# Patient Record
Sex: Female | Born: 1935 | Race: White | Hispanic: No | Marital: Married | Smoking: Former smoker
Health system: Southern US, Community
[De-identification: ages and names within clinical notes are randomized; demographics above are authoritative.]

## PROBLEM LIST (undated history)

## (undated) DIAGNOSIS — Z923 Personal history of irradiation: Secondary | ICD-10-CM

## (undated) DIAGNOSIS — M810 Age-related osteoporosis without current pathological fracture: Secondary | ICD-10-CM

## (undated) DIAGNOSIS — T7840XA Allergy, unspecified, initial encounter: Secondary | ICD-10-CM

## (undated) DIAGNOSIS — C50912 Malignant neoplasm of unspecified site of left female breast: Secondary | ICD-10-CM

## (undated) DIAGNOSIS — I341 Nonrheumatic mitral (valve) prolapse: Secondary | ICD-10-CM

## (undated) HISTORY — DX: Personal history of irradiation: Z92.3

## (undated) HISTORY — DX: Nonrheumatic mitral (valve) prolapse: I34.1

## (undated) HISTORY — DX: Allergy, unspecified, initial encounter: T78.40XA

## (undated) HISTORY — PX: TONSILLECTOMY: SUR1361

## (undated) HISTORY — DX: Age-related osteoporosis without current pathological fracture: M81.0

---

## 1997-10-26 ENCOUNTER — Ambulatory Visit (HOSPITAL_COMMUNITY): Admission: RE | Admit: 1997-10-26 | Discharge: 1997-10-26 | Payer: Self-pay | Admitting: Obstetrics and Gynecology

## 1997-11-29 ENCOUNTER — Other Ambulatory Visit: Admission: RE | Admit: 1997-11-29 | Discharge: 1997-11-29 | Payer: Self-pay | Admitting: Obstetrics and Gynecology

## 1998-12-30 ENCOUNTER — Ambulatory Visit (HOSPITAL_COMMUNITY): Admission: RE | Admit: 1998-12-30 | Discharge: 1998-12-30 | Payer: Self-pay | Admitting: Obstetrics and Gynecology

## 1998-12-30 ENCOUNTER — Encounter: Payer: Self-pay | Admitting: Obstetrics and Gynecology

## 1999-03-11 ENCOUNTER — Other Ambulatory Visit: Admission: RE | Admit: 1999-03-11 | Discharge: 1999-03-11 | Payer: Self-pay | Admitting: Obstetrics and Gynecology

## 2000-08-05 ENCOUNTER — Encounter: Payer: Self-pay | Admitting: Obstetrics and Gynecology

## 2000-08-05 ENCOUNTER — Ambulatory Visit (HOSPITAL_COMMUNITY): Admission: RE | Admit: 2000-08-05 | Discharge: 2000-08-05 | Payer: Self-pay | Admitting: Obstetrics and Gynecology

## 2000-08-19 ENCOUNTER — Other Ambulatory Visit: Admission: RE | Admit: 2000-08-19 | Discharge: 2000-08-19 | Payer: Self-pay | Admitting: Obstetrics and Gynecology

## 2001-08-22 ENCOUNTER — Other Ambulatory Visit: Admission: RE | Admit: 2001-08-22 | Discharge: 2001-08-22 | Payer: Self-pay | Admitting: Obstetrics and Gynecology

## 2001-10-06 ENCOUNTER — Encounter: Admission: RE | Admit: 2001-10-06 | Discharge: 2001-10-06 | Payer: Self-pay | Admitting: Obstetrics and Gynecology

## 2001-10-06 ENCOUNTER — Encounter: Payer: Self-pay | Admitting: Obstetrics and Gynecology

## 2001-12-27 ENCOUNTER — Encounter: Payer: Self-pay | Admitting: Obstetrics and Gynecology

## 2001-12-27 ENCOUNTER — Ambulatory Visit (HOSPITAL_COMMUNITY): Admission: RE | Admit: 2001-12-27 | Discharge: 2001-12-27 | Payer: Self-pay | Admitting: Obstetrics and Gynecology

## 2004-09-01 ENCOUNTER — Other Ambulatory Visit: Admission: RE | Admit: 2004-09-01 | Discharge: 2004-09-01 | Payer: Self-pay | Admitting: Obstetrics and Gynecology

## 2005-09-11 ENCOUNTER — Ambulatory Visit (HOSPITAL_COMMUNITY): Admission: RE | Admit: 2005-09-11 | Discharge: 2005-09-11 | Payer: Self-pay | Admitting: Obstetrics and Gynecology

## 2005-10-01 ENCOUNTER — Other Ambulatory Visit: Admission: RE | Admit: 2005-10-01 | Discharge: 2005-10-01 | Payer: Self-pay | Admitting: Obstetrics and Gynecology

## 2006-09-24 ENCOUNTER — Ambulatory Visit (HOSPITAL_COMMUNITY): Admission: RE | Admit: 2006-09-24 | Discharge: 2006-09-24 | Payer: Self-pay | Admitting: Obstetrics and Gynecology

## 2006-10-14 ENCOUNTER — Other Ambulatory Visit: Admission: RE | Admit: 2006-10-14 | Discharge: 2006-10-14 | Payer: Self-pay | Admitting: Obstetrics and Gynecology

## 2007-12-06 ENCOUNTER — Ambulatory Visit (HOSPITAL_COMMUNITY): Admission: RE | Admit: 2007-12-06 | Discharge: 2007-12-06 | Payer: Self-pay | Admitting: Cardiology

## 2008-12-24 ENCOUNTER — Ambulatory Visit (HOSPITAL_COMMUNITY): Admission: RE | Admit: 2008-12-24 | Discharge: 2008-12-24 | Payer: Self-pay | Admitting: Family Medicine

## 2009-12-25 ENCOUNTER — Ambulatory Visit (HOSPITAL_COMMUNITY): Admission: RE | Admit: 2009-12-25 | Discharge: 2009-12-25 | Payer: Self-pay | Admitting: Family Medicine

## 2010-02-12 ENCOUNTER — Other Ambulatory Visit: Admission: RE | Admit: 2010-02-12 | Discharge: 2010-02-12 | Payer: Self-pay | Admitting: Obstetrics and Gynecology

## 2010-05-04 ENCOUNTER — Encounter: Payer: Self-pay | Admitting: Obstetrics and Gynecology

## 2011-03-02 ENCOUNTER — Other Ambulatory Visit (HOSPITAL_COMMUNITY): Payer: Self-pay | Admitting: Family Medicine

## 2011-03-02 DIAGNOSIS — Z1231 Encounter for screening mammogram for malignant neoplasm of breast: Secondary | ICD-10-CM

## 2011-04-01 ENCOUNTER — Ambulatory Visit (HOSPITAL_COMMUNITY)
Admission: RE | Admit: 2011-04-01 | Discharge: 2011-04-01 | Disposition: A | Discharge status: Home / Self Care | Payer: Medicare Other | Source: Ambulatory Visit | Attending: Family Medicine | Admitting: Family Medicine

## 2011-04-01 DIAGNOSIS — Z1231 Encounter for screening mammogram for malignant neoplasm of breast: Secondary | ICD-10-CM | POA: Insufficient documentation

## 2012-04-28 ENCOUNTER — Other Ambulatory Visit (HOSPITAL_COMMUNITY): Payer: Self-pay | Admitting: Family Medicine

## 2012-04-28 DIAGNOSIS — Z1231 Encounter for screening mammogram for malignant neoplasm of breast: Secondary | ICD-10-CM

## 2012-05-06 ENCOUNTER — Ambulatory Visit (HOSPITAL_COMMUNITY): Payer: Medicare Other

## 2012-05-12 ENCOUNTER — Ambulatory Visit (HOSPITAL_COMMUNITY)
Admission: RE | Admit: 2012-05-12 | Discharge: 2012-05-12 | Disposition: A | Discharge status: Home / Self Care | Payer: Medicare Other | Source: Ambulatory Visit | Attending: Family Medicine | Admitting: Family Medicine

## 2012-05-12 DIAGNOSIS — Z1231 Encounter for screening mammogram for malignant neoplasm of breast: Secondary | ICD-10-CM | POA: Insufficient documentation

## 2013-05-29 ENCOUNTER — Other Ambulatory Visit (HOSPITAL_COMMUNITY): Payer: Self-pay | Admitting: Family Medicine

## 2013-05-29 DIAGNOSIS — Z1231 Encounter for screening mammogram for malignant neoplasm of breast: Secondary | ICD-10-CM

## 2013-06-29 ENCOUNTER — Ambulatory Visit (HOSPITAL_COMMUNITY): Payer: Medicare Other

## 2013-07-04 ENCOUNTER — Ambulatory Visit (HOSPITAL_COMMUNITY)
Admission: RE | Admit: 2013-07-04 | Discharge: 2013-07-04 | Disposition: A | Discharge status: Home / Self Care | Payer: Medicare HMO | Source: Ambulatory Visit | Attending: Family Medicine | Admitting: Family Medicine

## 2013-07-04 DIAGNOSIS — Z1231 Encounter for screening mammogram for malignant neoplasm of breast: Secondary | ICD-10-CM

## 2014-07-02 ENCOUNTER — Other Ambulatory Visit (HOSPITAL_COMMUNITY): Payer: Self-pay | Admitting: Family Medicine

## 2014-07-02 DIAGNOSIS — Z1231 Encounter for screening mammogram for malignant neoplasm of breast: Secondary | ICD-10-CM

## 2014-07-16 ENCOUNTER — Ambulatory Visit (HOSPITAL_COMMUNITY): Payer: Medicare HMO | Attending: Family Medicine

## 2014-07-24 ENCOUNTER — Ambulatory Visit (HOSPITAL_COMMUNITY): Payer: Medicare HMO | Attending: Family Medicine

## 2014-07-25 ENCOUNTER — Ambulatory Visit (HOSPITAL_COMMUNITY)
Admission: RE | Admit: 2014-07-25 | Discharge: 2014-07-25 | Disposition: A | Discharge status: Home / Self Care | Payer: Medicare HMO | Source: Ambulatory Visit | Attending: Family Medicine | Admitting: Family Medicine

## 2014-07-25 DIAGNOSIS — Z1231 Encounter for screening mammogram for malignant neoplasm of breast: Secondary | ICD-10-CM | POA: Diagnosis not present

## 2015-03-12 DIAGNOSIS — Z23 Encounter for immunization: Secondary | ICD-10-CM | POA: Diagnosis not present

## 2015-05-23 DIAGNOSIS — L814 Other melanin hyperpigmentation: Secondary | ICD-10-CM | POA: Diagnosis not present

## 2015-05-23 DIAGNOSIS — L821 Other seborrheic keratosis: Secondary | ICD-10-CM | POA: Diagnosis not present

## 2015-05-23 DIAGNOSIS — L853 Xerosis cutis: Secondary | ICD-10-CM | POA: Diagnosis not present

## 2015-05-23 DIAGNOSIS — D1801 Hemangioma of skin and subcutaneous tissue: Secondary | ICD-10-CM | POA: Diagnosis not present

## 2015-11-21 DIAGNOSIS — M810 Age-related osteoporosis without current pathological fracture: Secondary | ICD-10-CM | POA: Diagnosis not present

## 2015-11-28 DIAGNOSIS — M810 Age-related osteoporosis without current pathological fracture: Secondary | ICD-10-CM | POA: Diagnosis not present

## 2015-11-28 DIAGNOSIS — E559 Vitamin D deficiency, unspecified: Secondary | ICD-10-CM | POA: Diagnosis not present

## 2015-11-28 DIAGNOSIS — R7309 Other abnormal glucose: Secondary | ICD-10-CM | POA: Diagnosis not present

## 2015-11-28 DIAGNOSIS — Z23 Encounter for immunization: Secondary | ICD-10-CM | POA: Diagnosis not present

## 2016-01-13 ENCOUNTER — Other Ambulatory Visit: Payer: Self-pay | Admitting: Family Medicine

## 2016-01-13 DIAGNOSIS — Z1231 Encounter for screening mammogram for malignant neoplasm of breast: Secondary | ICD-10-CM

## 2016-01-13 DIAGNOSIS — R69 Illness, unspecified: Secondary | ICD-10-CM | POA: Diagnosis not present

## 2016-01-23 ENCOUNTER — Ambulatory Visit
Admission: RE | Admit: 2016-01-23 | Discharge: 2016-01-23 | Disposition: A | Discharge status: Home / Self Care | Payer: Medicare HMO | Source: Ambulatory Visit | Attending: Family Medicine | Admitting: Family Medicine

## 2016-01-23 DIAGNOSIS — Z1231 Encounter for screening mammogram for malignant neoplasm of breast: Secondary | ICD-10-CM | POA: Diagnosis not present

## 2016-01-31 DIAGNOSIS — J302 Other seasonal allergic rhinitis: Secondary | ICD-10-CM | POA: Diagnosis not present

## 2016-01-31 DIAGNOSIS — R7303 Prediabetes: Secondary | ICD-10-CM | POA: Diagnosis not present

## 2016-01-31 DIAGNOSIS — E7800 Pure hypercholesterolemia, unspecified: Secondary | ICD-10-CM | POA: Diagnosis not present

## 2016-01-31 DIAGNOSIS — E559 Vitamin D deficiency, unspecified: Secondary | ICD-10-CM | POA: Diagnosis not present

## 2016-01-31 DIAGNOSIS — R7301 Impaired fasting glucose: Secondary | ICD-10-CM | POA: Diagnosis not present

## 2016-01-31 DIAGNOSIS — M810 Age-related osteoporosis without current pathological fracture: Secondary | ICD-10-CM | POA: Diagnosis not present

## 2016-01-31 DIAGNOSIS — Z1389 Encounter for screening for other disorder: Secondary | ICD-10-CM | POA: Diagnosis not present

## 2016-01-31 DIAGNOSIS — Z0000 Encounter for general adult medical examination without abnormal findings: Secondary | ICD-10-CM | POA: Diagnosis not present

## 2016-05-14 DIAGNOSIS — R69 Illness, unspecified: Secondary | ICD-10-CM | POA: Diagnosis not present

## 2016-05-14 DIAGNOSIS — Z0101 Encounter for examination of eyes and vision with abnormal findings: Secondary | ICD-10-CM | POA: Diagnosis not present

## 2016-05-14 DIAGNOSIS — H524 Presbyopia: Secondary | ICD-10-CM | POA: Diagnosis not present

## 2016-05-20 DIAGNOSIS — D2371 Other benign neoplasm of skin of right lower limb, including hip: Secondary | ICD-10-CM | POA: Diagnosis not present

## 2016-05-20 DIAGNOSIS — L814 Other melanin hyperpigmentation: Secondary | ICD-10-CM | POA: Diagnosis not present

## 2016-05-20 DIAGNOSIS — L438 Other lichen planus: Secondary | ICD-10-CM | POA: Diagnosis not present

## 2016-05-20 DIAGNOSIS — L821 Other seborrheic keratosis: Secondary | ICD-10-CM | POA: Diagnosis not present

## 2016-05-20 DIAGNOSIS — L820 Inflamed seborrheic keratosis: Secondary | ICD-10-CM | POA: Diagnosis not present

## 2016-06-04 DIAGNOSIS — H02423 Myogenic ptosis of bilateral eyelids: Secondary | ICD-10-CM | POA: Diagnosis not present

## 2016-06-04 DIAGNOSIS — H53483 Generalized contraction of visual field, bilateral: Secondary | ICD-10-CM | POA: Diagnosis not present

## 2016-06-04 DIAGNOSIS — H02834 Dermatochalasis of left upper eyelid: Secondary | ICD-10-CM | POA: Diagnosis not present

## 2016-06-04 DIAGNOSIS — H02413 Mechanical ptosis of bilateral eyelids: Secondary | ICD-10-CM | POA: Diagnosis not present

## 2016-06-04 DIAGNOSIS — H0279 Other degenerative disorders of eyelid and periocular area: Secondary | ICD-10-CM | POA: Diagnosis not present

## 2016-06-04 DIAGNOSIS — H02831 Dermatochalasis of right upper eyelid: Secondary | ICD-10-CM | POA: Diagnosis not present

## 2016-06-15 DIAGNOSIS — Z0101 Encounter for examination of eyes and vision with abnormal findings: Secondary | ICD-10-CM | POA: Diagnosis not present

## 2016-06-29 DIAGNOSIS — H02413 Mechanical ptosis of bilateral eyelids: Secondary | ICD-10-CM | POA: Diagnosis not present

## 2016-06-29 DIAGNOSIS — H02831 Dermatochalasis of right upper eyelid: Secondary | ICD-10-CM | POA: Diagnosis not present

## 2016-06-29 DIAGNOSIS — H02834 Dermatochalasis of left upper eyelid: Secondary | ICD-10-CM | POA: Diagnosis not present

## 2016-12-01 DIAGNOSIS — J069 Acute upper respiratory infection, unspecified: Secondary | ICD-10-CM | POA: Diagnosis not present

## 2016-12-01 DIAGNOSIS — R05 Cough: Secondary | ICD-10-CM | POA: Diagnosis not present

## 2016-12-15 DIAGNOSIS — R69 Illness, unspecified: Secondary | ICD-10-CM | POA: Diagnosis not present

## 2016-12-28 ENCOUNTER — Other Ambulatory Visit: Payer: Self-pay | Admitting: Family Medicine

## 2016-12-28 DIAGNOSIS — Z1231 Encounter for screening mammogram for malignant neoplasm of breast: Secondary | ICD-10-CM

## 2017-01-11 DIAGNOSIS — R69 Illness, unspecified: Secondary | ICD-10-CM | POA: Diagnosis not present

## 2017-01-27 ENCOUNTER — Ambulatory Visit
Admission: RE | Admit: 2017-01-27 | Discharge: 2017-01-27 | Disposition: A | Discharge status: Home / Self Care | Payer: Medicare HMO | Source: Ambulatory Visit | Attending: Family Medicine | Admitting: Family Medicine

## 2017-01-27 DIAGNOSIS — Z1231 Encounter for screening mammogram for malignant neoplasm of breast: Secondary | ICD-10-CM

## 2017-02-12 DIAGNOSIS — Z0000 Encounter for general adult medical examination without abnormal findings: Secondary | ICD-10-CM | POA: Diagnosis not present

## 2017-02-12 DIAGNOSIS — Z1211 Encounter for screening for malignant neoplasm of colon: Secondary | ICD-10-CM | POA: Diagnosis not present

## 2017-02-12 DIAGNOSIS — Z1389 Encounter for screening for other disorder: Secondary | ICD-10-CM | POA: Diagnosis not present

## 2017-02-12 DIAGNOSIS — J302 Other seasonal allergic rhinitis: Secondary | ICD-10-CM | POA: Diagnosis not present

## 2017-02-12 DIAGNOSIS — E7800 Pure hypercholesterolemia, unspecified: Secondary | ICD-10-CM | POA: Diagnosis not present

## 2017-02-12 DIAGNOSIS — M810 Age-related osteoporosis without current pathological fracture: Secondary | ICD-10-CM | POA: Diagnosis not present

## 2017-02-12 DIAGNOSIS — R7303 Prediabetes: Secondary | ICD-10-CM | POA: Diagnosis not present

## 2017-02-12 DIAGNOSIS — E559 Vitamin D deficiency, unspecified: Secondary | ICD-10-CM | POA: Diagnosis not present

## 2017-05-19 DIAGNOSIS — D126 Benign neoplasm of colon, unspecified: Secondary | ICD-10-CM | POA: Diagnosis not present

## 2017-05-19 DIAGNOSIS — Z86010 Personal history of colonic polyps: Secondary | ICD-10-CM | POA: Diagnosis not present

## 2017-05-25 DIAGNOSIS — D126 Benign neoplasm of colon, unspecified: Secondary | ICD-10-CM | POA: Diagnosis not present

## 2017-05-28 DIAGNOSIS — D2372 Other benign neoplasm of skin of left lower limb, including hip: Secondary | ICD-10-CM | POA: Diagnosis not present

## 2017-05-28 DIAGNOSIS — D1801 Hemangioma of skin and subcutaneous tissue: Secondary | ICD-10-CM | POA: Diagnosis not present

## 2017-05-28 DIAGNOSIS — D224 Melanocytic nevi of scalp and neck: Secondary | ICD-10-CM | POA: Diagnosis not present

## 2017-05-28 DIAGNOSIS — L814 Other melanin hyperpigmentation: Secondary | ICD-10-CM | POA: Diagnosis not present

## 2017-05-28 DIAGNOSIS — L821 Other seborrheic keratosis: Secondary | ICD-10-CM | POA: Diagnosis not present

## 2017-05-28 DIAGNOSIS — L738 Other specified follicular disorders: Secondary | ICD-10-CM | POA: Diagnosis not present

## 2017-06-30 DIAGNOSIS — R69 Illness, unspecified: Secondary | ICD-10-CM | POA: Diagnosis not present

## 2017-07-20 DIAGNOSIS — H43813 Vitreous degeneration, bilateral: Secondary | ICD-10-CM | POA: Diagnosis not present

## 2017-07-20 DIAGNOSIS — H524 Presbyopia: Secondary | ICD-10-CM | POA: Diagnosis not present

## 2017-12-09 DIAGNOSIS — M8588 Other specified disorders of bone density and structure, other site: Secondary | ICD-10-CM | POA: Diagnosis not present

## 2017-12-09 DIAGNOSIS — M810 Age-related osteoporosis without current pathological fracture: Secondary | ICD-10-CM | POA: Diagnosis not present

## 2017-12-14 DIAGNOSIS — J069 Acute upper respiratory infection, unspecified: Secondary | ICD-10-CM | POA: Diagnosis not present

## 2018-01-13 ENCOUNTER — Other Ambulatory Visit: Payer: Self-pay | Admitting: Family Medicine

## 2018-01-13 DIAGNOSIS — Z1231 Encounter for screening mammogram for malignant neoplasm of breast: Secondary | ICD-10-CM

## 2018-01-15 DIAGNOSIS — R69 Illness, unspecified: Secondary | ICD-10-CM | POA: Diagnosis not present

## 2018-02-14 DIAGNOSIS — E7800 Pure hypercholesterolemia, unspecified: Secondary | ICD-10-CM | POA: Diagnosis not present

## 2018-02-14 DIAGNOSIS — R7303 Prediabetes: Secondary | ICD-10-CM | POA: Diagnosis not present

## 2018-02-14 DIAGNOSIS — E559 Vitamin D deficiency, unspecified: Secondary | ICD-10-CM | POA: Diagnosis not present

## 2018-02-15 ENCOUNTER — Ambulatory Visit: Payer: Medicare HMO

## 2018-02-17 DIAGNOSIS — M810 Age-related osteoporosis without current pathological fracture: Secondary | ICD-10-CM | POA: Diagnosis not present

## 2018-02-17 DIAGNOSIS — Z0000 Encounter for general adult medical examination without abnormal findings: Secondary | ICD-10-CM | POA: Diagnosis not present

## 2018-02-17 DIAGNOSIS — R7303 Prediabetes: Secondary | ICD-10-CM | POA: Diagnosis not present

## 2018-02-17 DIAGNOSIS — E7800 Pure hypercholesterolemia, unspecified: Secondary | ICD-10-CM | POA: Diagnosis not present

## 2018-02-17 DIAGNOSIS — Z209 Contact with and (suspected) exposure to unspecified communicable disease: Secondary | ICD-10-CM | POA: Diagnosis not present

## 2018-02-17 DIAGNOSIS — J302 Other seasonal allergic rhinitis: Secondary | ICD-10-CM | POA: Diagnosis not present

## 2018-02-17 DIAGNOSIS — D72819 Decreased white blood cell count, unspecified: Secondary | ICD-10-CM | POA: Diagnosis not present

## 2018-02-17 DIAGNOSIS — E559 Vitamin D deficiency, unspecified: Secondary | ICD-10-CM | POA: Diagnosis not present

## 2018-02-17 DIAGNOSIS — Z1389 Encounter for screening for other disorder: Secondary | ICD-10-CM | POA: Diagnosis not present

## 2018-02-17 DIAGNOSIS — Z1211 Encounter for screening for malignant neoplasm of colon: Secondary | ICD-10-CM | POA: Diagnosis not present

## 2018-03-18 DIAGNOSIS — D72819 Decreased white blood cell count, unspecified: Secondary | ICD-10-CM | POA: Diagnosis not present

## 2018-03-30 ENCOUNTER — Encounter: Payer: Self-pay | Admitting: Internal Medicine

## 2018-03-30 ENCOUNTER — Telehealth: Payer: Self-pay | Admitting: Internal Medicine

## 2018-03-30 NOTE — Telephone Encounter (Signed)
New referral received from Dr. Moreen Fowler for the pt to see a hematologist. Pt has been cld and scheduled to see Dr. Walden Field on 04/21/18 at 950am. PPt aware to arrive 30 minutes early. Letter mailed.

## 2018-03-31 ENCOUNTER — Ambulatory Visit
Admission: RE | Admit: 2018-03-31 | Discharge: 2018-03-31 | Disposition: A | Discharge status: Home / Self Care | Payer: Medicare HMO | Source: Ambulatory Visit | Attending: Family Medicine | Admitting: Family Medicine

## 2018-03-31 DIAGNOSIS — Z1231 Encounter for screening mammogram for malignant neoplasm of breast: Secondary | ICD-10-CM | POA: Diagnosis not present

## 2018-04-21 ENCOUNTER — Inpatient Hospital Stay: Payer: PPO

## 2018-04-21 ENCOUNTER — Inpatient Hospital Stay: Payer: PPO | Attending: Internal Medicine | Admitting: Internal Medicine

## 2018-04-21 ENCOUNTER — Encounter: Payer: Self-pay | Admitting: Internal Medicine

## 2018-04-21 ENCOUNTER — Telehealth: Payer: Self-pay

## 2018-04-21 VITALS — BP 147/78 | HR 73 | Temp 99.0°F | Resp 17 | Ht 63.0 in | Wt 120.8 lb

## 2018-04-21 DIAGNOSIS — Z809 Family history of malignant neoplasm, unspecified: Secondary | ICD-10-CM

## 2018-04-21 DIAGNOSIS — Z87891 Personal history of nicotine dependence: Secondary | ICD-10-CM

## 2018-04-21 DIAGNOSIS — Z800 Family history of malignant neoplasm of digestive organs: Secondary | ICD-10-CM

## 2018-04-21 DIAGNOSIS — M810 Age-related osteoporosis without current pathological fracture: Secondary | ICD-10-CM

## 2018-04-21 DIAGNOSIS — D72818 Other decreased white blood cell count: Secondary | ICD-10-CM | POA: Diagnosis not present

## 2018-04-21 DIAGNOSIS — Z79899 Other long term (current) drug therapy: Secondary | ICD-10-CM

## 2018-04-21 DIAGNOSIS — Z806 Family history of leukemia: Secondary | ICD-10-CM

## 2018-04-21 LAB — COMPREHENSIVE METABOLIC PANEL
ALT: 15 U/L (ref 0–44)
AST: 16 U/L (ref 15–41)
Albumin: 3.6 g/dL (ref 3.5–5.0)
Alkaline Phosphatase: 52 U/L (ref 38–126)
Anion gap: 6 (ref 5–15)
BILIRUBIN TOTAL: 1.1 mg/dL (ref 0.3–1.2)
BUN: 12 mg/dL (ref 8–23)
CHLORIDE: 107 mmol/L (ref 98–111)
CO2: 28 mmol/L (ref 22–32)
Calcium: 9.1 mg/dL (ref 8.9–10.3)
Creatinine, Ser: 0.75 mg/dL (ref 0.44–1.00)
Glucose, Bld: 105 mg/dL — ABNORMAL HIGH (ref 70–99)
Potassium: 4.8 mmol/L (ref 3.5–5.1)
Sodium: 141 mmol/L (ref 135–145)
TOTAL PROTEIN: 6.2 g/dL — AB (ref 6.5–8.1)

## 2018-04-21 LAB — CBC WITH DIFFERENTIAL/PLATELET
Abs Immature Granulocytes: 0 10*3/uL (ref 0.00–0.07)
Basophils Absolute: 0 10*3/uL (ref 0.0–0.1)
Basophils Relative: 1 %
EOS ABS: 0 10*3/uL (ref 0.0–0.5)
Eosinophils Relative: 1 %
HCT: 39.2 % (ref 36.0–46.0)
Hemoglobin: 12.7 g/dL (ref 12.0–15.0)
Immature Granulocytes: 0 %
Lymphocytes Relative: 35 %
Lymphs Abs: 1.3 10*3/uL (ref 0.7–4.0)
MCH: 30.3 pg (ref 26.0–34.0)
MCHC: 32.4 g/dL (ref 30.0–36.0)
MCV: 93.6 fL (ref 80.0–100.0)
MONO ABS: 0.4 10*3/uL (ref 0.1–1.0)
MONOS PCT: 9 %
Neutro Abs: 2.1 10*3/uL (ref 1.7–7.7)
Neutrophils Relative %: 54 %
PLATELETS: 174 10*3/uL (ref 150–400)
RBC: 4.19 MIL/uL (ref 3.87–5.11)
RDW: 12.5 % (ref 11.5–15.5)
WBC: 3.8 10*3/uL — ABNORMAL LOW (ref 4.0–10.5)
nRBC: 0 % (ref 0.0–0.2)

## 2018-04-21 LAB — LACTATE DEHYDROGENASE: LDH: 147 U/L (ref 98–192)

## 2018-04-21 NOTE — Progress Notes (Signed)
Referring Physician:  Dr. Antony Contras of [facility].    Diagnosis Other decreased white blood cell (WBC) count - Plan: CBC with Differential/Platelet, Comprehensive metabolic panel, Lactate dehydrogenase, ANA, IFA (with reflex), Hepatitis B surface antibody, Hepatitis B surface antigen, Hepatitis C RNA quantitative, Hepatitis panel, acute, HIV antibody (with reflex)  Staging Cancer Staging No matching staging information was found for the patient.  Assessment and Plan:  1.  Leukopenia.  83 year old female referred for evaluation of leucopenia.  Pt reports long standing history of being told she had a low WBC count.  She denies persistent infections.  She traveled recently to [place].  She denies any recent illnesses.  She exercises at least 5 times a week.  She has started no new medications.  Labs done 03/18/2018 reviewed and showed WBC 2.6 HB 13.5 plts 154,000. She has a normal differential.  Labs done 02/14/2018 reviewed and showed WBC 2.8 HB 13.4 plts 158,000.  She denies any fevers, chills, night sweats and has noted no adenopathy.  Pt is an only child.  She reports mother died of lymphoma in her 9s and father had history of colon cancer.  Pt has reportedly undergone colonoscopy and mammogram evaluation as recommended.  Pt is seen today for consultation due to leukopenia.   Labs done today 04/21/2018 reviewed and showed WBC 3.8 Hb 12.7 plts 174,000.  Pt has normal differential.  Chemistries WNL with K+ 4.8 Cr 0.75 and normal LFTs.  Awaiting results of ANA, hepatitis panel and HIV testing.  I discussed with pt I suspect this is a normal variant.  Pt reassured.  She will RTC in 2 weeks to go over labs.  She is advised to notify the office if any problems prior to her next visit.    2.  Family history of lymphoma.  She reports her mother died in her 56's of this.    3.  Family history of colon cancer.  She reports this occurred in her father.  Pt reports she has undergone colonoscopy in the past.    4.  Osteoporosis.  Pt is on Boniva.  Follow-up with PCP for bone density monitoring.    5  Health maintenance.  Continue mammogram and GI evaluation as recommended.  Mammogram 03/2018 was negative with repeat imaging recommended in 03/2019.    40 minutes spent with more than 50% spent in counseling and coordination of care and review of records.    HPI:  83 year old female referred for evaluation of leucopenia.  Pt reports long standing history of being told she had a low WBC count.  She denies persistent infections.  She traveled recently to [place].  She denies any recent illnesses.  She exercises at least 5 times a week.  She has started no new medications.  Labs done 03/18/2018 reviewed and showed WBC 2.6 HB 13.5 plts 154,000. She has a normal differential.  Labs done 02/14/2018 reviewed and showed WBC 2.8 HB 13.4 plts 158,000.  She denies any fevers, chills, night sweats and has noted no adenopathy.  Pt is an only child.  She reports mother died of lymphoma in her 66s and father had history of colon cancer.  Pt has reportedly undergone colonoscopy and mammogram evaluation as recommended.  Pt seen today for consultation due to leukopenia.    Problem List There are no active problems to display for this patient.   Past Medical History Past Medical History:  Diagnosis Date  . Allergy   . Osteoporosis  Past Surgical History Past Surgical History:  Procedure Laterality Date  . TONSILLECTOMY     83 years old    Family History Family History  Problem Relation Age of Onset  . Cancer Mother   . Hypertension Mother   . Cancer Father      Social History  reports that she quit smoking about 40 years ago. Her smoking use included cigarettes. She has never used smokeless tobacco. She reports current alcohol use. She reports that she does not use drugs.  Medications  Current Outpatient Medications:  .  Cetirizine HCl (ZYRTEC PO), Take by mouth., Disp: , Rfl:  .  Cholecalciferol  (VITAMIN D-3) 125 MCG (5000 UT) TABS, Take 50 mcg by mouth daily., Disp: , Rfl:  .  ibandronate (BONIVA) 150 MG tablet, Take 150 mg by mouth every 30 (thirty) days. Take in the morning with a full glass of water, on an empty stomach, and do not take anything else by mouth or lie down for the next 30 min., Disp: , Rfl:  .  Multiple Vitamin (MULTIVITAMIN) tablet, Take 1 tablet by mouth daily., Disp: , Rfl:   Allergies Patient has no known allergies.  Review of Systems Review of Systems - Oncology Mcdiarmid negative   Physical Exam  Vitals Wt Readings from Last 3 Encounters:  04/21/18 120 lb 12.8 oz (54.8 kg)   Temp Readings from Last 3 Encounters:  04/21/18 99 F (37.2 C) (Oral)   BP Readings from Last 3 Encounters:  04/21/18 (!) 147/78   Pulse Readings from Last 3 Encounters:  04/21/18 73   Constitutional: Well-developed, well-nourished, and in no distress.   HENT: Head: Normocephalic and atraumatic.  Mouth/Throat: No oropharyngeal exudate. Mucosa moist. Eyes: Pupils are equal, round, and reactive to light. Conjunctivae are normal. No scleral icterus.  Neck: Normal range of motion. Neck supple. No JVD present.  Cardiovascular: Normal rate, regular rhythm and normal heart sounds.  Exam reveals no gallop and no friction rub.   No murmur heard. Pulmonary/Chest: Effort normal and breath sounds normal. No respiratory distress. No wheezes.No rales.  Abdominal: Soft. Bowel sounds are normal. No distension. There is no tenderness. There is no guarding.  Musculoskeletal: No edema or tenderness.  Lymphadenopathy: No cervical,axillary or supraclavicular adenopathy.  Neurological: Alert and oriented to person, place, and time. No cranial nerve deficit.  Skin: Skin is warm and dry. No rash noted. No erythema. No pallor.  Psychiatric: Affect and judgment normal.   Labs Appointment on 04/21/2018  Component Date Value Ref Range Status  . WBC 04/21/2018 3.8* 4.0 - 10.5 K/uL Final  . RBC  04/21/2018 4.19  3.87 - 5.11 MIL/uL Final  . Hemoglobin 04/21/2018 12.7  12.0 - 15.0 g/dL Final  . HCT 04/21/2018 39.2  36.0 - 46.0 % Final  . MCV 04/21/2018 93.6  80.0 - 100.0 fL Final  . MCH 04/21/2018 30.3  26.0 - 34.0 pg Final  . MCHC 04/21/2018 32.4  30.0 - 36.0 g/dL Final  . RDW 04/21/2018 12.5  11.5 - 15.5 % Final  . Platelets 04/21/2018 174  150 - 400 K/uL Final  . nRBC 04/21/2018 0.0  0.0 - 0.2 % Final  . Neutrophils Relative % 04/21/2018 54  % Final  . Neutro Abs 04/21/2018 2.1  1.7 - 7.7 K/uL Final  . Lymphocytes Relative 04/21/2018 35  % Final  . Lymphs Abs 04/21/2018 1.3  0.7 - 4.0 K/uL Final  . Monocytes Relative 04/21/2018 9  % Final  . Monocytes Absolute  04/21/2018 0.4  0.1 - 1.0 K/uL Final  . Eosinophils Relative 04/21/2018 1  % Final  . Eosinophils Absolute 04/21/2018 0.0  0.0 - 0.5 K/uL Final  . Basophils Relative 04/21/2018 1  % Final  . Basophils Absolute 04/21/2018 0.0  0.0 - 0.1 K/uL Final  . Immature Granulocytes 04/21/2018 0  % Final  . Abs Immature Granulocytes 04/21/2018 0.00  0.00 - 0.07 K/uL Final   Performed at [facility], [place] [address] [place], [place] [address]  . Sodium 04/21/2018 141  135 - 145 mmol/L Final  . Potassium 04/21/2018 4.8  3.5 - 5.1 mmol/L Final  . Chloride 04/21/2018 107  98 - 111 mmol/L Final  . CO2 04/21/2018 28  22 - 32 mmol/L Final  . Glucose, Bld 04/21/2018 105* 70 - 99 mg/dL Final  . BUN 04/21/2018 12  8 - 23 mg/dL Final  . Creatinine, Ser 04/21/2018 0.75  0.44 - 1.00 mg/dL Final  . Calcium 04/21/2018 9.1  8.9 - 10.3 mg/dL Final  . Total Protein 04/21/2018 6.2* 6.5 - 8.1 g/dL Final  . Albumin 04/21/2018 3.6  3.5 - 5.0 g/dL Final  . AST 04/21/2018 16  15 - 41 U/L Final  . ALT 04/21/2018 15  0 - 44 U/L Final  . Alkaline Phosphatase 04/21/2018 52  38 - 126 U/L Final  . Total Bilirubin 04/21/2018 1.1  0.3 - 1.2 mg/dL Final  . GFR calc non Af Amer 04/21/2018 >60  >60 mL/min Final  . GFR calc Af Amer  04/21/2018 >60  >60 mL/min Final  . Anion gap 04/21/2018 6  5 - 15 Final   Performed at [facility], [place] [address], [place], [place] [address]  . LDH 04/21/2018 147  98 - 192 U/L Final   Performed at [facility], [place] [address], [place], [place] [address]     Pathology Orders Placed This Encounter  Procedures  . CBC with Differential/Platelet    Standing Status:   Future    Number of Occurrences:   1    Standing Expiration Date:   04/22/2019  . Comprehensive metabolic panel    Standing Status:   Future    Number of Occurrences:   1    Standing Expiration Date:   04/22/2019  . Lactate dehydrogenase    Standing Status:   Future    Number of Occurrences:   1    Standing Expiration Date:   04/22/2019  . ANA, IFA (with reflex)    Standing Status:   Future    Number of Occurrences:   1    Standing Expiration Date:   04/22/2019  . Hepatitis B surface antibody    Standing Status:   Future    Number of Occurrences:   1    Standing Expiration Date:   04/22/2019  . Hepatitis B surface antigen    Standing Status:   Future    Number of Occurrences:   1    Standing Expiration Date:   04/22/2019  . Hepatitis C RNA quantitative    Standing Status:   Future    Number of Occurrences:   1    Standing Expiration Date:   04/22/2019  . Hepatitis panel, acute    Standing Status:   Future    Number of Occurrences:   1    Standing Expiration Date:   04/22/2019  . HIV antibody (with reflex)    Standing Status:  Future    Number of Occurrences:   1    Standing Expiration Date:   04/22/2019       Zoila Shutter MD

## 2018-04-21 NOTE — Telephone Encounter (Signed)
Printed avs and calender of upcoming appointment. Per 1/9 los 

## 2018-04-22 LAB — HIV ANTIBODY (ROUTINE TESTING W REFLEX): HIV Screen 4th Generation wRfx: NONREACTIVE

## 2018-04-22 LAB — HEPATITIS PANEL, ACUTE
HCV Ab: <0.1 s/co ratio (ref 0.0–0.9)
Hep A IgM: NEGATIVE
Hep B C IgM: NEGATIVE
Hepatitis B Surface Ag: NEGATIVE

## 2018-04-22 LAB — HEPATITIS B SURFACE ANTIBODY,QUALITATIVE: Hep B S Ab: NONREACTIVE

## 2018-04-22 LAB — HEPATITIS B SURFACE ANTIGEN: Hepatitis B Surface Ag: NEGATIVE

## 2018-04-22 LAB — ANTINUCLEAR ANTIBODIES, IFA: ANTINUCLEAR ANTIBODIES, IFA: NEGATIVE

## 2018-05-06 ENCOUNTER — Inpatient Hospital Stay (HOSPITAL_BASED_OUTPATIENT_CLINIC_OR_DEPARTMENT_OTHER): Payer: PPO | Admitting: Internal Medicine

## 2018-05-06 VITALS — BP 121/64 | HR 77 | Temp 98.4°F | Resp 17 | Ht 63.0 in | Wt 122.6 lb

## 2018-05-06 DIAGNOSIS — Z806 Family history of leukemia: Secondary | ICD-10-CM | POA: Diagnosis not present

## 2018-05-06 DIAGNOSIS — Z809 Family history of malignant neoplasm, unspecified: Secondary | ICD-10-CM

## 2018-05-06 DIAGNOSIS — Z79899 Other long term (current) drug therapy: Secondary | ICD-10-CM | POA: Diagnosis not present

## 2018-05-06 DIAGNOSIS — D72818 Other decreased white blood cell count: Secondary | ICD-10-CM

## 2018-05-06 DIAGNOSIS — M810 Age-related osteoporosis without current pathological fracture: Secondary | ICD-10-CM

## 2018-05-06 DIAGNOSIS — Z800 Family history of malignant neoplasm of digestive organs: Secondary | ICD-10-CM | POA: Diagnosis not present

## 2018-05-06 DIAGNOSIS — Z87891 Personal history of nicotine dependence: Secondary | ICD-10-CM

## 2018-05-06 NOTE — Progress Notes (Signed)
Diagnosis Other decreased white blood cell (WBC) count - Plan: CBC with Differential/Platelet, Comprehensive metabolic panel, Lactate dehydrogenase  Staging Cancer Staging No matching staging information was found for the patient.  Assessment and Plan:  1.  Leukopenia.  83 year old female referred for evaluation of leucopenia.  Pt reports long standing history of being told she had a low WBC count.  She denies persistent infections.  She traveled recently to [place].  She denies any recent illnesses.  She exercises at least 5 times a week.  She has started no new medications.  Labs done 03/18/2018 reviewed and showed WBC 2.6 HB 13.5 plts 154,000. She has a normal differential.  Labs done 02/14/2018 reviewed and showed WBC 2.8 HB 13.4 plts 158,000.  She denies any fevers, chills, night sweats and has noted no adenopathy.  Pt is an only child.  She reports mother died of lymphoma in her 28s and father had history of colon cancer.  Pt has reportedly undergone colonoscopy and mammogram evaluation as recommended.  Pt was seen initially due to leukopenia.   Labs done 04/21/2018 reviewed and showed WBC 3.8 Hb 12.7 plts 174,000.  Pt has normal differential.  Chemistries WNL with K+ 4.8 Cr 0.75 and normal LFTs.  She has negative ANA, hepatitis panel and HIV testing.  I discussed with pt I suspect this is a normal variant.  Pt reassured.  WBC done 04/21/2018 improved from prior labs done by PCP in 03/2018.  Pt should continue to follow-up with PCP.  She will RTC in 1 year with labs.  She is advised to notify the office if any problems prior to her next visit.    2.  Family history of lymphoma.  She reports her mother died in her 22's of this.    3.  Family history of colon cancer.  She reports this occurred in her father.  Pt reports she has undergone colonoscopy in the past.   4.  Osteoporosis.  Pt is on Boniva.  Follow-up with PCP for bone density monitoring.    5  Health maintenance.  Continue mammogram and GI  evaluation as recommended.  Mammogram 03/2018 was negative with repeat imaging recommended in 03/2019.    Interval History:  Historical data obtained from noted dated 04/21/2018:  83 year old female referred for evaluation of leucopenia.  Pt reports long standing history of being told she had a low WBC count.  She denies persistent infections.  She traveled recently to [place].  She denies any recent illnesses.  She exercises at least 5 times a week.  She has started no new medications.  Labs done 03/18/2018 reviewed and showed WBC 2.6 HB 13.5 plts 154,000. She has a normal differential.  Labs done 02/14/2018 reviewed and showed WBC 2.8 HB 13.4 plts 158,000.  She denies any fevers, chills, night sweats and has noted no adenopathy.  Pt is an only child.  She reports mother died of lymphoma in her 25s and father had history of colon cancer.  Pt has reportedly undergone colonoscopy and mammogram evaluation as recommended.  Pt initially seen for consultation due to leukopenia.    Current Status:  Pt is seen today for follow-up.  She is here to go over labs.    Problem List There are no active problems to display for this patient.   Past Medical History Past Medical History:  Diagnosis Date  . Allergy   . Osteoporosis     Past Surgical History Past Surgical History:  Procedure  Laterality Date  . TONSILLECTOMY     83 years old    Family History Family History  Problem Relation Age of Onset  . Cancer Mother   . Hypertension Mother   . Cancer Father      Social History  reports that she quit smoking about 40 years ago. Her smoking use included cigarettes. She has never used smokeless tobacco. She reports current alcohol use. She reports that she does not use drugs.  Medications  Current Outpatient Medications:  .  calcium carbonate (CALCIUM 600) 600 MG TABS tablet, Take 600 mg by mouth daily with breakfast., Disp: , Rfl:  .  Cetirizine HCl (ZYRTEC PO), Take by mouth. Take generic   Allertec., Disp: , Rfl:  .  Cholecalciferol (VITAMIN D-3) 125 MCG (5000 UT) TABS, Take 50 mcg by mouth daily., Disp: , Rfl:  .  ibandronate (BONIVA) 150 MG tablet, Take 150 mg by mouth every 30 (thirty) days. Take in the morning with a full glass of water, on an empty stomach, and do not take anything else by mouth or lie down for the next 30 min., Disp: , Rfl:  .  Multiple Vitamin (MULTIVITAMIN) tablet, Take 1 tablet by mouth daily., Disp: , Rfl:   Allergies Patient has no known allergies.  Review of Systems Review of Systems - Oncology [place] negative   Physical Exam  Vitals Wt Readings from Last 3 Encounters:  05/06/18 122 lb 9.6 oz (55.6 kg)  04/21/18 120 lb 12.8 oz (54.8 kg)   Temp Readings from Last 3 Encounters:  05/06/18 98.4 F (36.9 C) (Oral)  04/21/18 99 F (37.2 C) (Oral)   BP Readings from Last 3 Encounters:  05/06/18 121/64  04/21/18 (!) 147/78   Pulse Readings from Last 3 Encounters:  05/06/18 77  04/21/18 73   Constitutional: Well-developed, well-nourished, and in no distress.   HENT: Head: Normocephalic and atraumatic.  Mouth/Throat: No oropharyngeal exudate. Mucosa moist. Eyes: Pupils are equal, round, and reactive to light. Conjunctivae are normal. No scleral icterus.  Neck: Normal range of motion. Neck supple. No JVD present.  Cardiovascular: Normal rate, regular rhythm and normal heart sounds.  Exam reveals no gallop and no friction rub.   No murmur heard. Pulmonary/Chest: Effort normal and breath sounds normal. No respiratory distress. No wheezes.No rales.  Abdominal: Soft. Bowel sounds are normal. No distension. There is no tenderness. There is no guarding.  Musculoskeletal: No edema or tenderness.  Lymphadenopathy: No cervical, axillary or supraclavicular adenopathy.  Neurological: Alert and oriented to person, place, and time. No cranial nerve deficit.  Skin: Skin is warm and dry. No rash noted. No erythema. No pallor.  Psychiatric: Affect and  judgment normal.   Labs No visits with results within 3 Day(s) from this visit.  Latest known visit with results is:  Appointment on 04/21/2018  Component Date Value Ref Range Status  . WBC 04/21/2018 3.8* 4.0 - 10.5 K/uL Final  . RBC 04/21/2018 4.19  3.87 - 5.11 MIL/uL Final  . Hemoglobin 04/21/2018 12.7  12.0 - 15.0 g/dL Final  . HCT 04/21/2018 39.2  36.0 - 46.0 % Final  . MCV 04/21/2018 93.6  80.0 - 100.0 fL Final  . MCH 04/21/2018 30.3  26.0 - 34.0 pg Final  . MCHC 04/21/2018 32.4  30.0 - 36.0 g/dL Final  . RDW 04/21/2018 12.5  11.5 - 15.5 % Final  . Platelets 04/21/2018 174  150 - 400 K/uL Final  . nRBC 04/21/2018 0.0  0.0 - 0.2 %  Final  . Neutrophils Relative % 04/21/2018 54  % Final  . Neutro Abs 04/21/2018 2.1  1.7 - 7.7 K/uL Final  . Lymphocytes Relative 04/21/2018 35  % Final  . Lymphs Abs 04/21/2018 1.3  0.7 - 4.0 K/uL Final  . Monocytes Relative 04/21/2018 9  % Final  . Monocytes Absolute 04/21/2018 0.4  0.1 - 1.0 K/uL Final  . Eosinophils Relative 04/21/2018 1  % Final  . Eosinophils Absolute 04/21/2018 0.0  0.0 - 0.5 K/uL Final  . Basophils Relative 04/21/2018 1  % Final  . Basophils Absolute 04/21/2018 0.0  0.0 - 0.1 K/uL Final  . Immature Granulocytes 04/21/2018 0  % Final  . Abs Immature Granulocytes 04/21/2018 0.00  0.00 - 0.07 K/uL Final   Performed at [facility], [place] [address], [place], [place] [address]  . HIV Screen 4th Generation wRfx 04/21/2018 Non Reactive  Non Reactive Final   Comment: (NOTE) Performed At: [facility] [place], [place] [id number] Rush Farmer MD IN:[id number]   . Sodium 04/21/2018 141  135 - 145 mmol/L Final  . Potassium 04/21/2018 4.8  3.5 - 5.1 mmol/L Final  . Chloride 04/21/2018 107  98 - 111 mmol/L Final  . CO2 04/21/2018 28  22 - 32 mmol/L Final  . Glucose, Bld 04/21/2018 105* 70 - 99 mg/dL Final  . BUN 04/21/2018 12  8 - 23 mg/dL Final  . Creatinine, Ser 04/21/2018 0.75   0.44 - 1.00 mg/dL Final  . Calcium 04/21/2018 9.1  8.9 - 10.3 mg/dL Final  . Total Protein 04/21/2018 6.2* 6.5 - 8.1 g/dL Final  . Albumin 04/21/2018 3.6  3.5 - 5.0 g/dL Final  . AST 04/21/2018 16  15 - 41 U/L Final  . ALT 04/21/2018 15  0 - 44 U/L Final  . Alkaline Phosphatase 04/21/2018 52  38 - 126 U/L Final  . Total Bilirubin 04/21/2018 1.1  0.3 - 1.2 mg/dL Final  . GFR calc non Af Amer 04/21/2018 >60  >60 mL/min Final  . GFR calc Af Amer 04/21/2018 >60  >60 mL/min Final  . Anion gap 04/21/2018 6  5 - 15 Final   Performed at [facility], [place] [address] [place], [place] [address]  . LDH 04/21/2018 147  98 - 192 U/L Final   Performed at [facility], [place] [address] [place], [place] [address]  . ANA Ab, IFA 04/21/2018 Negative   Final   Comment: (NOTE)                                     Negative   <1:80                                     Borderline  1:80                                     Positive   >1:80 Performed At: [facility] [place], [place] [id number] Rush Farmer MD TW:[id number]   . Hep B S Ab 04/21/2018 Non Reactive   Final   Comment: (NOTE)              Non Reactive: Inconsistent with immunity,  less than 10 mIU/mL              Reactive:     Consistent with immunity,                            greater than 9.9 mIU/mL Performed At: [facility] [place], [place] [id number] Rush Farmer MD [id number]   . Hepatitis B Surface Ag 04/21/2018 Negative  Negative Final   Comment: (NOTE) Performed At: [facility] [place], [place] [id number] Rush Farmer MD FM:[id number]   . Hepatitis B Surface Ag 04/21/2018 Negative  Negative Final  . HCV Ab 04/21/2018 <0.1  0.0 - 0.9 s/co ratio Final   Comment: (NOTE)                                  Negative:     < 0.8                             Indeterminate: 0.8 - 0.9                                   Positive:     > 0.9 The CDC recommends that a positive HCV antibody result be followed up with a HCV Nucleic Acid Amplification test (701779). Performed At: [facility] [place], [place] [id number] Rush Farmer MD RA:[id number]   . Hep A IgM 04/21/2018 Negative  Negative Final  . Hep B C IgM 04/21/2018 Negative  Negative Final     Pathology Orders Placed This Encounter  Procedures  . CBC with Differential/Platelet    Standing Status:   Future    Standing Expiration Date:   05/06/2020  . Comprehensive metabolic panel    Standing Status:   Future    Standing Expiration Date:   05/06/2020  . Lactate dehydrogenase    Standing Status:   Future    Standing Expiration Date:   05/06/2020       Zoila Shutter MD

## 2018-05-09 ENCOUNTER — Telehealth: Payer: Self-pay | Admitting: Internal Medicine

## 2018-05-09 NOTE — Telephone Encounter (Signed)
Provided does not have a template schedule for 3702.  Will schedule patient appt when template is available.

## 2018-06-01 DIAGNOSIS — L821 Other seborrheic keratosis: Secondary | ICD-10-CM | POA: Diagnosis not present

## 2018-06-01 DIAGNOSIS — D1721 Benign lipomatous neoplasm of skin and subcutaneous tissue of right arm: Secondary | ICD-10-CM | POA: Diagnosis not present

## 2018-06-01 DIAGNOSIS — D2372 Other benign neoplasm of skin of left lower limb, including hip: Secondary | ICD-10-CM | POA: Diagnosis not present

## 2018-06-01 DIAGNOSIS — D2262 Melanocytic nevi of left upper limb, including shoulder: Secondary | ICD-10-CM | POA: Diagnosis not present

## 2018-06-01 DIAGNOSIS — D2261 Melanocytic nevi of right upper limb, including shoulder: Secondary | ICD-10-CM | POA: Diagnosis not present

## 2018-06-01 DIAGNOSIS — D225 Melanocytic nevi of trunk: Secondary | ICD-10-CM | POA: Diagnosis not present

## 2019-02-21 ENCOUNTER — Other Ambulatory Visit: Payer: Self-pay | Admitting: Family Medicine

## 2019-02-21 DIAGNOSIS — Z1231 Encounter for screening mammogram for malignant neoplasm of breast: Secondary | ICD-10-CM

## 2019-03-13 DIAGNOSIS — E559 Vitamin D deficiency, unspecified: Secondary | ICD-10-CM | POA: Diagnosis not present

## 2019-03-13 DIAGNOSIS — E7800 Pure hypercholesterolemia, unspecified: Secondary | ICD-10-CM | POA: Diagnosis not present

## 2019-03-13 DIAGNOSIS — R7303 Prediabetes: Secondary | ICD-10-CM | POA: Diagnosis not present

## 2019-03-16 DIAGNOSIS — R0600 Dyspnea, unspecified: Secondary | ICD-10-CM | POA: Diagnosis not present

## 2019-03-16 DIAGNOSIS — E559 Vitamin D deficiency, unspecified: Secondary | ICD-10-CM | POA: Diagnosis not present

## 2019-03-16 DIAGNOSIS — R7303 Prediabetes: Secondary | ICD-10-CM | POA: Diagnosis not present

## 2019-03-16 DIAGNOSIS — Z1389 Encounter for screening for other disorder: Secondary | ICD-10-CM | POA: Diagnosis not present

## 2019-03-16 DIAGNOSIS — E7800 Pure hypercholesterolemia, unspecified: Secondary | ICD-10-CM | POA: Diagnosis not present

## 2019-03-16 DIAGNOSIS — J302 Other seasonal allergic rhinitis: Secondary | ICD-10-CM | POA: Diagnosis not present

## 2019-03-16 DIAGNOSIS — Z1211 Encounter for screening for malignant neoplasm of colon: Secondary | ICD-10-CM | POA: Diagnosis not present

## 2019-03-16 DIAGNOSIS — M810 Age-related osteoporosis without current pathological fracture: Secondary | ICD-10-CM | POA: Diagnosis not present

## 2019-03-16 DIAGNOSIS — Z0000 Encounter for general adult medical examination without abnormal findings: Secondary | ICD-10-CM | POA: Diagnosis not present

## 2019-04-17 ENCOUNTER — Other Ambulatory Visit: Payer: Self-pay

## 2019-04-17 ENCOUNTER — Ambulatory Visit
Admission: RE | Admit: 2019-04-17 | Discharge: 2019-04-17 | Disposition: A | Discharge status: Home / Self Care | Payer: PPO | Source: Ambulatory Visit | Attending: Family Medicine | Admitting: Family Medicine

## 2019-04-17 DIAGNOSIS — Z1231 Encounter for screening mammogram for malignant neoplasm of breast: Secondary | ICD-10-CM | POA: Diagnosis not present

## 2019-04-30 ENCOUNTER — Ambulatory Visit: Payer: PPO | Attending: Internal Medicine

## 2019-04-30 DIAGNOSIS — Z23 Encounter for immunization: Secondary | ICD-10-CM | POA: Insufficient documentation

## 2019-04-30 NOTE — Progress Notes (Signed)
   Covid-19 Vaccination Clinic  Name:  Becky Ramirez    MRN: [id number] DOB: [birth year]  04/30/2019  Becky Ramirez was observed post Covid-19 immunization for 15 minutes without incidence. She was provided with Vaccine Information Sheet and instruction to access the V-Safe system.   Becky Ramirez was instructed to call 911 with any severe reactions post vaccine: Marland Kitchen Difficulty breathing  . Swelling of your face and throat  . A fast heartbeat  . A bad rash all over your body  . Dizziness and weakness   Patient was discharged at 12:44.

## 2019-05-21 ENCOUNTER — Ambulatory Visit: Payer: PPO | Attending: Internal Medicine

## 2019-05-21 DIAGNOSIS — Z23 Encounter for immunization: Secondary | ICD-10-CM | POA: Insufficient documentation

## 2019-05-21 NOTE — Progress Notes (Signed)
   Covid-19 Vaccination Clinic  Name:  Becky Ramirez    MRN: [id number] DOB: 1935/10/06  05/21/2019  [name] was observed post Covid-19 immunization for 15 minutes without incidence. She was provided with Vaccine Information Sheet and instruction to access the V-Safe system.   [name] was instructed to call 911 with any severe reactions post vaccine: Marland Kitchen Difficulty breathing  . Swelling of your face and throat  . A fast heartbeat  . A bad rash all over your body  . Dizziness and weakness    Immunizations Administered    Name Date Dose VIS Date Route   Pfizer COVID-19 Vaccine 05/21/2019 11:23 AM 0.3 mL 03/24/2019 Intramuscular   Manufacturer: [place]   Lot: [id number]   [place]: [id number]

## 2019-06-08 DIAGNOSIS — L570 Actinic keratosis: Secondary | ICD-10-CM | POA: Diagnosis not present

## 2019-06-08 DIAGNOSIS — L821 Other seborrheic keratosis: Secondary | ICD-10-CM | POA: Diagnosis not present

## 2019-06-08 DIAGNOSIS — L720 Epidermal cyst: Secondary | ICD-10-CM | POA: Diagnosis not present

## 2019-06-08 DIAGNOSIS — D2262 Melanocytic nevi of left upper limb, including shoulder: Secondary | ICD-10-CM | POA: Diagnosis not present

## 2019-06-08 DIAGNOSIS — D2261 Melanocytic nevi of right upper limb, including shoulder: Secondary | ICD-10-CM | POA: Diagnosis not present

## 2019-06-08 DIAGNOSIS — L814 Other melanin hyperpigmentation: Secondary | ICD-10-CM | POA: Diagnosis not present

## 2019-06-08 DIAGNOSIS — L738 Other specified follicular disorders: Secondary | ICD-10-CM | POA: Diagnosis not present

## 2019-12-05 DIAGNOSIS — M85851 Other specified disorders of bone density and structure, right thigh: Secondary | ICD-10-CM | POA: Diagnosis not present

## 2019-12-05 DIAGNOSIS — M85852 Other specified disorders of bone density and structure, left thigh: Secondary | ICD-10-CM | POA: Diagnosis not present

## 2019-12-05 DIAGNOSIS — M810 Age-related osteoporosis without current pathological fracture: Secondary | ICD-10-CM | POA: Diagnosis not present

## 2020-03-15 DIAGNOSIS — R7303 Prediabetes: Secondary | ICD-10-CM | POA: Diagnosis not present

## 2020-03-15 DIAGNOSIS — R7309 Other abnormal glucose: Secondary | ICD-10-CM | POA: Diagnosis not present

## 2020-03-15 DIAGNOSIS — E7800 Pure hypercholesterolemia, unspecified: Secondary | ICD-10-CM | POA: Diagnosis not present

## 2020-03-15 DIAGNOSIS — E559 Vitamin D deficiency, unspecified: Secondary | ICD-10-CM | POA: Diagnosis not present

## 2020-03-19 ENCOUNTER — Other Ambulatory Visit: Payer: Self-pay | Admitting: Family Medicine

## 2020-03-19 DIAGNOSIS — M810 Age-related osteoporosis without current pathological fracture: Secondary | ICD-10-CM | POA: Diagnosis not present

## 2020-03-19 DIAGNOSIS — E559 Vitamin D deficiency, unspecified: Secondary | ICD-10-CM | POA: Diagnosis not present

## 2020-03-19 DIAGNOSIS — Z1389 Encounter for screening for other disorder: Secondary | ICD-10-CM | POA: Diagnosis not present

## 2020-03-19 DIAGNOSIS — Z1231 Encounter for screening mammogram for malignant neoplasm of breast: Secondary | ICD-10-CM

## 2020-03-19 DIAGNOSIS — Z1211 Encounter for screening for malignant neoplasm of colon: Secondary | ICD-10-CM | POA: Diagnosis not present

## 2020-03-19 DIAGNOSIS — J302 Other seasonal allergic rhinitis: Secondary | ICD-10-CM | POA: Diagnosis not present

## 2020-03-19 DIAGNOSIS — D72819 Decreased white blood cell count, unspecified: Secondary | ICD-10-CM | POA: Diagnosis not present

## 2020-03-19 DIAGNOSIS — Z0000 Encounter for general adult medical examination without abnormal findings: Secondary | ICD-10-CM | POA: Diagnosis not present

## 2020-03-19 DIAGNOSIS — R7303 Prediabetes: Secondary | ICD-10-CM | POA: Diagnosis not present

## 2020-03-19 DIAGNOSIS — E7800 Pure hypercholesterolemia, unspecified: Secondary | ICD-10-CM | POA: Diagnosis not present

## 2020-04-30 DIAGNOSIS — M810 Age-related osteoporosis without current pathological fracture: Secondary | ICD-10-CM | POA: Diagnosis not present

## 2020-05-01 ENCOUNTER — Other Ambulatory Visit: Payer: Self-pay

## 2020-05-01 ENCOUNTER — Ambulatory Visit
Admission: RE | Admit: 2020-05-01 | Discharge: 2020-05-01 | Disposition: A | Discharge status: Home / Self Care | Payer: PPO | Source: Ambulatory Visit | Attending: Family Medicine | Admitting: Family Medicine

## 2020-05-01 DIAGNOSIS — Z1231 Encounter for screening mammogram for malignant neoplasm of breast: Secondary | ICD-10-CM | POA: Diagnosis not present

## 2020-06-03 DIAGNOSIS — L720 Epidermal cyst: Secondary | ICD-10-CM | POA: Diagnosis not present

## 2020-06-03 DIAGNOSIS — D485 Neoplasm of uncertain behavior of skin: Secondary | ICD-10-CM | POA: Diagnosis not present

## 2020-06-03 DIAGNOSIS — L430 Hypertrophic lichen planus: Secondary | ICD-10-CM | POA: Diagnosis not present

## 2020-06-03 DIAGNOSIS — L814 Other melanin hyperpigmentation: Secondary | ICD-10-CM | POA: Diagnosis not present

## 2020-06-03 DIAGNOSIS — L821 Other seborrheic keratosis: Secondary | ICD-10-CM | POA: Diagnosis not present

## 2020-06-05 DIAGNOSIS — W010XXA Fall on same level from slipping, tripping and stumbling without subsequent striking against object, initial encounter: Secondary | ICD-10-CM | POA: Diagnosis not present

## 2020-06-05 DIAGNOSIS — S0101XA Laceration without foreign body of scalp, initial encounter: Secondary | ICD-10-CM | POA: Diagnosis not present

## 2020-06-12 DIAGNOSIS — Z4802 Encounter for removal of sutures: Secondary | ICD-10-CM | POA: Diagnosis not present

## 2020-07-29 DIAGNOSIS — U071 COVID-19: Secondary | ICD-10-CM | POA: Diagnosis not present

## 2020-09-20 ENCOUNTER — Other Ambulatory Visit (HOSPITAL_BASED_OUTPATIENT_CLINIC_OR_DEPARTMENT_OTHER): Payer: Self-pay

## 2020-09-20 ENCOUNTER — Ambulatory Visit: Payer: PPO | Attending: Internal Medicine

## 2020-09-20 ENCOUNTER — Other Ambulatory Visit: Payer: Self-pay

## 2020-09-20 DIAGNOSIS — Z23 Encounter for immunization: Secondary | ICD-10-CM

## 2020-09-20 MED ORDER — PFIZER-BIONT COVID-19 VAC-TRIS 30 MCG/0.3ML IM SUSP
INTRAMUSCULAR | 0 refills | Status: DC
  Filled 2020-09-20: qty 0.3, 1d supply, fill #0

## 2020-09-20 NOTE — Progress Notes (Signed)
   Covid-19 Vaccination Clinic  Name:  Becky Ramirez    MRN: [id number] DOB: [birth year]  09/20/2020  [name] was observed post Covid-19 immunization for 15 minutes without incident. She was provided with Vaccine Information Sheet and instruction to access the V-Safe system.   [name] was instructed to call 911 with any severe reactions post vaccine: Difficulty breathing  Swelling of face and throat  A fast heartbeat  A bad rash all over body  Dizziness and weakness   Immunizations Administered     Name Date Dose VIS Date Route   PFIZER Comrnaty(Gray TOP) Covid-19 Vaccine 09/20/2020 10:44 AM 0.3 mL 03/21/2020 Intramuscular   Manufacturer: [place]   Lot: [id number]   [place]: [phone]

## 2020-10-29 DIAGNOSIS — M810 Age-related osteoporosis without current pathological fracture: Secondary | ICD-10-CM | POA: Diagnosis not present

## 2020-12-01 DIAGNOSIS — L03032 Cellulitis of left toe: Secondary | ICD-10-CM | POA: Diagnosis not present

## 2021-02-03 ENCOUNTER — Other Ambulatory Visit (HOSPITAL_BASED_OUTPATIENT_CLINIC_OR_DEPARTMENT_OTHER): Payer: Self-pay

## 2021-02-03 ENCOUNTER — Ambulatory Visit: Payer: PPO | Attending: Internal Medicine

## 2021-02-03 ENCOUNTER — Other Ambulatory Visit: Payer: Self-pay

## 2021-02-03 DIAGNOSIS — Z23 Encounter for immunization: Secondary | ICD-10-CM

## 2021-02-03 MED ORDER — PFIZER COVID-19 VAC BIVALENT 30 MCG/0.3ML IM SUSP
INTRAMUSCULAR | 0 refills | Status: DC
  Filled 2021-02-03: qty 0.3, 1d supply, fill #0

## 2021-02-03 NOTE — Progress Notes (Signed)
   Covid-19 Vaccination Clinic  Name:  Becky Ramirez    MRN: [id number] DOB: [birth year]  02/03/2021  Ms. Becky Ramirez was observed post Covid-19 immunization for 15 minutes without incident. She was provided with Vaccine Information Sheet and instruction to access the V-Safe system.   Ms. Becky Ramirez was instructed to call 911 with Becky severe reactions post vaccine: Difficulty breathing  Swelling of face and throat  A fast heartbeat  A bad rash all over body  Dizziness and weakness   Immunizations Administered     Name Date Dose VIS Date Route   Pfizer Covid-19 Vaccine Bivalent Booster 02/03/2021  9:47 AM 0.3 mL 12/11/2020 Intramuscular   Manufacturer: [place]   Lot: [id number]   [place]: [id number]

## 2021-02-13 ENCOUNTER — Other Ambulatory Visit (HOSPITAL_BASED_OUTPATIENT_CLINIC_OR_DEPARTMENT_OTHER): Payer: Self-pay

## 2021-02-13 MED ORDER — INFLUENZA VAC A&B SA ADJ QUAD 0.5 ML IM PRSY
PREFILLED_SYRINGE | INTRAMUSCULAR | 0 refills | Status: DC
  Filled 2021-02-13: qty 0.5, 1d supply, fill #0

## 2021-04-18 DIAGNOSIS — E7800 Pure hypercholesterolemia, unspecified: Secondary | ICD-10-CM | POA: Diagnosis not present

## 2021-04-18 DIAGNOSIS — E559 Vitamin D deficiency, unspecified: Secondary | ICD-10-CM | POA: Diagnosis not present

## 2021-04-18 DIAGNOSIS — R7303 Prediabetes: Secondary | ICD-10-CM | POA: Diagnosis not present

## 2021-04-21 DIAGNOSIS — Z8616 Personal history of COVID-19: Secondary | ICD-10-CM | POA: Diagnosis not present

## 2021-04-21 DIAGNOSIS — J302 Other seasonal allergic rhinitis: Secondary | ICD-10-CM | POA: Diagnosis not present

## 2021-04-21 DIAGNOSIS — Z1211 Encounter for screening for malignant neoplasm of colon: Secondary | ICD-10-CM | POA: Diagnosis not present

## 2021-04-21 DIAGNOSIS — Z1389 Encounter for screening for other disorder: Secondary | ICD-10-CM | POA: Diagnosis not present

## 2021-04-21 DIAGNOSIS — E559 Vitamin D deficiency, unspecified: Secondary | ICD-10-CM | POA: Diagnosis not present

## 2021-04-21 DIAGNOSIS — E7800 Pure hypercholesterolemia, unspecified: Secondary | ICD-10-CM | POA: Diagnosis not present

## 2021-04-21 DIAGNOSIS — M810 Age-related osteoporosis without current pathological fracture: Secondary | ICD-10-CM | POA: Diagnosis not present

## 2021-04-21 DIAGNOSIS — R7303 Prediabetes: Secondary | ICD-10-CM | POA: Diagnosis not present

## 2021-04-21 DIAGNOSIS — Z0000 Encounter for general adult medical examination without abnormal findings: Secondary | ICD-10-CM | POA: Diagnosis not present

## 2021-04-21 DIAGNOSIS — D72819 Decreased white blood cell count, unspecified: Secondary | ICD-10-CM | POA: Diagnosis not present

## 2021-05-12 DIAGNOSIS — M810 Age-related osteoporosis without current pathological fracture: Secondary | ICD-10-CM | POA: Diagnosis not present

## 2021-05-19 ENCOUNTER — Other Ambulatory Visit: Payer: Self-pay | Admitting: Family Medicine

## 2021-05-19 DIAGNOSIS — Z1231 Encounter for screening mammogram for malignant neoplasm of breast: Secondary | ICD-10-CM

## 2021-06-02 DIAGNOSIS — L821 Other seborrheic keratosis: Secondary | ICD-10-CM | POA: Diagnosis not present

## 2021-06-02 DIAGNOSIS — L820 Inflamed seborrheic keratosis: Secondary | ICD-10-CM | POA: Diagnosis not present

## 2021-06-02 DIAGNOSIS — D692 Other nonthrombocytopenic purpura: Secondary | ICD-10-CM | POA: Diagnosis not present

## 2021-06-02 DIAGNOSIS — L814 Other melanin hyperpigmentation: Secondary | ICD-10-CM | POA: Diagnosis not present

## 2021-06-04 ENCOUNTER — Ambulatory Visit
Admission: RE | Admit: 2021-06-04 | Discharge: 2021-06-04 | Disposition: A | Discharge status: Home / Self Care | Payer: PPO | Source: Ambulatory Visit | Attending: Family Medicine | Admitting: Family Medicine

## 2021-06-04 DIAGNOSIS — Z1231 Encounter for screening mammogram for malignant neoplasm of breast: Secondary | ICD-10-CM | POA: Diagnosis not present

## 2021-06-09 DIAGNOSIS — M810 Age-related osteoporosis without current pathological fracture: Secondary | ICD-10-CM | POA: Diagnosis not present

## 2021-06-09 DIAGNOSIS — J309 Allergic rhinitis, unspecified: Secondary | ICD-10-CM | POA: Diagnosis not present

## 2021-11-10 DIAGNOSIS — M810 Age-related osteoporosis without current pathological fracture: Secondary | ICD-10-CM | POA: Diagnosis not present

## 2022-04-05 DIAGNOSIS — R051 Acute cough: Secondary | ICD-10-CM | POA: Diagnosis not present

## 2022-04-05 DIAGNOSIS — R0981 Nasal congestion: Secondary | ICD-10-CM | POA: Diagnosis not present

## 2022-04-05 DIAGNOSIS — R5383 Other fatigue: Secondary | ICD-10-CM | POA: Diagnosis not present

## 2022-04-05 DIAGNOSIS — J101 Influenza due to other identified influenza virus with other respiratory manifestations: Secondary | ICD-10-CM | POA: Diagnosis not present

## 2022-04-05 DIAGNOSIS — Z03818 Encounter for observation for suspected exposure to other biological agents ruled out: Secondary | ICD-10-CM | POA: Diagnosis not present

## 2022-05-04 ENCOUNTER — Other Ambulatory Visit: Payer: Self-pay | Admitting: Family Medicine

## 2022-05-04 DIAGNOSIS — Z1231 Encounter for screening mammogram for malignant neoplasm of breast: Secondary | ICD-10-CM

## 2022-05-15 DIAGNOSIS — E559 Vitamin D deficiency, unspecified: Secondary | ICD-10-CM | POA: Diagnosis not present

## 2022-05-15 DIAGNOSIS — E7800 Pure hypercholesterolemia, unspecified: Secondary | ICD-10-CM | POA: Diagnosis not present

## 2022-05-15 DIAGNOSIS — D72819 Decreased white blood cell count, unspecified: Secondary | ICD-10-CM | POA: Diagnosis not present

## 2022-05-15 DIAGNOSIS — R7303 Prediabetes: Secondary | ICD-10-CM | POA: Diagnosis not present

## 2022-05-19 DIAGNOSIS — L603 Nail dystrophy: Secondary | ICD-10-CM | POA: Diagnosis not present

## 2022-05-19 DIAGNOSIS — R7303 Prediabetes: Secondary | ICD-10-CM | POA: Diagnosis not present

## 2022-05-19 DIAGNOSIS — E7800 Pure hypercholesterolemia, unspecified: Secondary | ICD-10-CM | POA: Diagnosis not present

## 2022-05-19 DIAGNOSIS — D72819 Decreased white blood cell count, unspecified: Secondary | ICD-10-CM | POA: Diagnosis not present

## 2022-05-19 DIAGNOSIS — M810 Age-related osteoporosis without current pathological fracture: Secondary | ICD-10-CM | POA: Diagnosis not present

## 2022-05-19 DIAGNOSIS — J302 Other seasonal allergic rhinitis: Secondary | ICD-10-CM | POA: Diagnosis not present

## 2022-05-19 DIAGNOSIS — Z0000 Encounter for general adult medical examination without abnormal findings: Secondary | ICD-10-CM | POA: Diagnosis not present

## 2022-05-19 DIAGNOSIS — E559 Vitamin D deficiency, unspecified: Secondary | ICD-10-CM | POA: Diagnosis not present

## 2022-05-19 DIAGNOSIS — Z1211 Encounter for screening for malignant neoplasm of colon: Secondary | ICD-10-CM | POA: Diagnosis not present

## 2022-05-19 DIAGNOSIS — Z1331 Encounter for screening for depression: Secondary | ICD-10-CM | POA: Diagnosis not present

## 2022-06-03 DIAGNOSIS — L821 Other seborrheic keratosis: Secondary | ICD-10-CM | POA: Diagnosis not present

## 2022-06-03 DIAGNOSIS — D2372 Other benign neoplasm of skin of left lower limb, including hip: Secondary | ICD-10-CM | POA: Diagnosis not present

## 2022-06-03 DIAGNOSIS — L814 Other melanin hyperpigmentation: Secondary | ICD-10-CM | POA: Diagnosis not present

## 2022-06-03 DIAGNOSIS — L570 Actinic keratosis: Secondary | ICD-10-CM | POA: Diagnosis not present

## 2022-06-16 DIAGNOSIS — M8589 Other specified disorders of bone density and structure, multiple sites: Secondary | ICD-10-CM | POA: Diagnosis not present

## 2022-06-18 DIAGNOSIS — Z23 Encounter for immunization: Secondary | ICD-10-CM | POA: Diagnosis not present

## 2022-06-22 ENCOUNTER — Ambulatory Visit
Admission: RE | Admit: 2022-06-22 | Discharge: 2022-06-22 | Disposition: A | Discharge status: Home / Self Care | Payer: PPO | Source: Ambulatory Visit | Attending: Family Medicine | Admitting: Family Medicine

## 2022-06-22 DIAGNOSIS — Z1231 Encounter for screening mammogram for malignant neoplasm of breast: Secondary | ICD-10-CM

## 2022-09-03 DIAGNOSIS — R222 Localized swelling, mass and lump, trunk: Secondary | ICD-10-CM | POA: Diagnosis not present

## 2022-09-03 DIAGNOSIS — I8390 Asymptomatic varicose veins of unspecified lower extremity: Secondary | ICD-10-CM | POA: Diagnosis not present

## 2022-09-04 ENCOUNTER — Other Ambulatory Visit: Payer: Self-pay | Admitting: Family Medicine

## 2022-09-04 DIAGNOSIS — R222 Localized swelling, mass and lump, trunk: Secondary | ICD-10-CM

## 2022-09-15 ENCOUNTER — Ambulatory Visit
Admission: RE | Admit: 2022-09-15 | Discharge: 2022-09-15 | Disposition: A | Discharge status: Home / Self Care | Payer: PPO | Source: Ambulatory Visit | Attending: Family Medicine | Admitting: Family Medicine

## 2022-09-15 DIAGNOSIS — R222 Localized swelling, mass and lump, trunk: Secondary | ICD-10-CM | POA: Diagnosis not present

## 2022-10-06 ENCOUNTER — Other Ambulatory Visit: Payer: Self-pay | Admitting: *Deleted

## 2022-10-06 DIAGNOSIS — I8393 Asymptomatic varicose veins of bilateral lower extremities: Secondary | ICD-10-CM

## 2022-10-20 ENCOUNTER — Ambulatory Visit (HOSPITAL_COMMUNITY): Payer: PPO

## 2022-11-18 DIAGNOSIS — M810 Age-related osteoporosis without current pathological fracture: Secondary | ICD-10-CM | POA: Diagnosis not present

## 2022-12-08 ENCOUNTER — Encounter: Payer: Self-pay | Admitting: Family Medicine

## 2022-12-08 DIAGNOSIS — R222 Localized swelling, mass and lump, trunk: Secondary | ICD-10-CM | POA: Diagnosis not present

## 2022-12-10 ENCOUNTER — Other Ambulatory Visit: Payer: Self-pay | Admitting: Family Medicine

## 2022-12-10 DIAGNOSIS — R222 Localized swelling, mass and lump, trunk: Secondary | ICD-10-CM

## 2022-12-11 ENCOUNTER — Other Ambulatory Visit: Payer: Self-pay | Admitting: Family Medicine

## 2022-12-11 DIAGNOSIS — R222 Localized swelling, mass and lump, trunk: Secondary | ICD-10-CM

## 2022-12-30 ENCOUNTER — Ambulatory Visit
Admission: RE | Admit: 2022-12-30 | Discharge: 2022-12-30 | Disposition: A | Discharge status: Home / Self Care | Payer: PPO | Source: Ambulatory Visit | Attending: Family Medicine | Admitting: Family Medicine

## 2022-12-30 DIAGNOSIS — R222 Localized swelling, mass and lump, trunk: Secondary | ICD-10-CM

## 2022-12-30 DIAGNOSIS — R918 Other nonspecific abnormal finding of lung field: Secondary | ICD-10-CM | POA: Diagnosis not present

## 2023-01-12 ENCOUNTER — Institutional Professional Consult (permissible substitution) (INDEPENDENT_AMBULATORY_CARE_PROVIDER_SITE_OTHER): Payer: PPO | Admitting: Otolaryngology

## 2023-01-13 ENCOUNTER — Other Ambulatory Visit (HOSPITAL_BASED_OUTPATIENT_CLINIC_OR_DEPARTMENT_OTHER): Payer: Self-pay

## 2023-01-13 MED ORDER — COVID-19 MRNA VAC-TRIS(PFIZER) 30 MCG/0.3ML IM SUSY
0.3000 mL | PREFILLED_SYRINGE | Freq: Once | INTRAMUSCULAR | 0 refills | Status: AC
  Filled 2023-01-13: qty 0.3, 1d supply, fill #0

## 2023-01-28 ENCOUNTER — Other Ambulatory Visit: Payer: Self-pay | Admitting: General Surgery

## 2023-01-28 DIAGNOSIS — R222 Localized swelling, mass and lump, trunk: Secondary | ICD-10-CM | POA: Diagnosis not present

## 2023-02-03 ENCOUNTER — Encounter (INDEPENDENT_AMBULATORY_CARE_PROVIDER_SITE_OTHER): Payer: Self-pay

## 2023-02-03 ENCOUNTER — Ambulatory Visit (INDEPENDENT_AMBULATORY_CARE_PROVIDER_SITE_OTHER): Payer: PPO | Admitting: Otolaryngology

## 2023-02-03 VITALS — Ht 63.0 in | Wt 122.0 lb

## 2023-02-03 DIAGNOSIS — H6123 Impacted cerumen, bilateral: Secondary | ICD-10-CM | POA: Diagnosis not present

## 2023-02-03 NOTE — Progress Notes (Signed)
Dear Dr. Azucena Cecil, Here is my assessment for our mutual patient, Becky Ramirez. Thank you for allowing me the opportunity to care for your patient. Please do not hesitate to contact me should you have any other questions. Sincerely, Dr. Jovita Kussmaul  Otolaryngology Clinic Note Referring provider: Dr. Azucena Cecil HPI:  Becky Ramirez is a 87 y.o. female kindly referred by Dr. Azucena Cecil for evaluation of cerumen impaction. She has had periodic impactions and has had them cleaned. She feels like her ear is closing up and feels like her prior episodes. Reports small ear canals. No prior audiograms but does not feel like hearing has declined.  She denies any vertigo, drainage, sharp pain, cracking/popping, barotrauma, prior ear surgery, vestibular suppressant use.  Has a chest wall mass she's scheduled to undergo surgery for  PMHx: MVP, Osteoporosis, AR  H&N Surgery: Tonsillectomy Personal or FHx of bleeding dz or anesthesia difficulty: no   Independent Review of Additional Tests or Records:  PCP notes reviewed  PMH/Meds/All/SocHx/FamHx/Mata:   Past Medical History:  Diagnosis Date   Allergy    MVP (mitral valve prolapse)    Osteoporosis      Past Surgical History:  Procedure Laterality Date   TONSILLECTOMY     87 years old    Family History  Problem Relation Age of Onset   Cancer Mother    Hypertension Mother    Cancer Father      Social Connections: Not on file     Current Outpatient Medications:    calcium carbonate (CALCIUM 600) 600 MG TABS tablet, Take 600 mg by mouth daily with breakfast., Disp: , Rfl:    Cetirizine HCl (ZYRTEC PO), Take by mouth. Take generic  Allertec., Disp: , Rfl:    Cholecalciferol (VITAMIN D-3) 125 MCG (5000 UT) TABS, Take 50 mcg by mouth daily., Disp: , Rfl:    COVID-19 mRNA bivalent vaccine, Pfizer, (PFIZER COVID-19 VAC BIVALENT) injection, Inject into the muscle., Disp: 0.3 mL, Rfl: 0   COVID-19 mRNA Vac-TriS, Pfizer, (PFIZER-BIONT COVID-19 VAC-TRIS) SUSP  injection, Inject into the muscle., Disp: 0.3 mL, Rfl: 0   denosumab (PROLIA) 60 MG/ML SOSY injection, Inject 60 mg into the skin every 6 (six) months., Disp: , Rfl:    influenza vaccine adjuvanted (FLUAD) 0.5 ML injection, Inject into the muscle., Disp: 0.5 mL, Rfl: 0   Multiple Vitamin (MULTIVITAMIN) tablet, Take 1 tablet by mouth daily., Disp: , Rfl:    ibandronate (BONIVA) 150 MG tablet, Take 150 mg by mouth every 30 (thirty) days. Take in the morning with a full glass of water, on an empty stomach, and do not take anything else by mouth or lie down for the next 30 min. (Patient not taking: Reported on 02/03/2023), Disp: , Rfl:    Physical Exam:   Ht 5\' 3"  (1.6 m)   Wt 122 lb (55.3 kg)   BMI 21.61 kg/m    Salient findings:  CN II-XII intact Bilateral cerumen impaction; after clearance, bilateral EAC clear and TM intact with well pneumatized middle ear spaces; narrow cartilaginous canals Weber 512: Midline Rinne 512: AC > BC b/l  Rine 1024: AC > BC b/l  Anterior rhinoscopy: Septum relatively midline, no masses noted No lesions of oral cavity/oropharynx No obviously palpable neck masses/lymphadenopathy/thyromegaly No respiratory distress or stridor  Procedures:  Procedure: Bilateral ear microscopy and cerumen removal using microscope (CPT 69210) - Mod 50 Pre-procedure diagnosis: Cerumen impaction bilateral external ears Post-procedure diagnosis: same Indication: Bilateral cerumen impaction; given patient's otologic complaints and history as  well as for improved and comprehensive examination of external ear and tympanic membrane, bilateral otologic examination using microscope was performed and impacted cerumen removed  Procedure: Patient was placed semi-recumbent. Both ear canals were examined using the microscope with findings above. Cerumen removed on left and on right using suction and currette with improvement in EAC examination and patency. Left: EAC was patent. dTM was intact.  Middle ear was aerate. Drainage: No Right: EAC was patent. TM was intact . Middle ear was aerated . Drainage: No Patient tolerated the procedure well.      Impression & Plans:  Becky Ramirez is a 87 y.o. female with no contributory PMHx with: Bilateral cerumen impaction - cleared today; use mineral oil drops once weekly -Discussed audiogram but she deferred   - f/u 6 months; her cerumen was quite hard and would like to see her to make sure she is on a good regimen such that we are not requiring frequent cerumen disimpactions  I have personally spent 50 minutes involved in face-to-face and non-face-to-face activities for this patient on the day of the visit.  Professional time spent includes the following activities, in addition to those noted in the documentation: preparing to see the patient (review of outside documentation), performing a medically appropriate examination and/or evaluation, counseling and educating the patient/family/caregiver, ordering medications, performing procedures (cerumen impaction clearance), referring and communicating with other healthcare professionals, documenting clinical information in the electronic or other health record, independently interpreting results and communicating results with the patient).      Thank you for allowing me the opportunity to care for your patient. Please do not hesitate to contact me should you have any other questions.  Sincerely, Jovita Kussmaul, MD Otolarynoglogist (ENT), [facility] Phone: [phone] Fax: [phone]  02/03/2023, 7:54 PM

## 2023-02-08 ENCOUNTER — Encounter (HOSPITAL_BASED_OUTPATIENT_CLINIC_OR_DEPARTMENT_OTHER): Payer: Self-pay | Admitting: General Surgery

## 2023-02-08 ENCOUNTER — Other Ambulatory Visit: Payer: Self-pay

## 2023-02-08 NOTE — Progress Notes (Signed)
   02/08/23 1044  Pre-op Phone Call  Surgery Date Verified 02/15/23  Arrival Time Verified 0615  Surgery Location Verified [facility] [place]  Medical History Reviewed Yes  Is the patient taking a GLP-1 receptor agonist? No  Do you have a history of heart problems? No  Antiarrhythmic device type  (NA)  Does patient have other implanted devices? No  Patient Teaching Pre / Post Procedure  Patient educated about smoking cessation 24 hours prior to surgery. N/A Non-Smoker  Med Rec Completed Yes  Take the Following Meds the Morning of Surgery no meds AM of sx; hold mvi/supplements x5d (sent text to pt to remind of instructions)  Recent  Lab Work, EKG, CXR? No  NPO (Including gum & candy) After midnight  Patient instructed to stop clear liquids including Carb loading drink at: 0415  Stop Solids, Milk, Candy, and Gum STARTING AT MIDNIGHT  Responsible adult to drive and be with you for 24 hours? Yes  Name & Phone Number for Ride/Caregiver son Chrissie Noa  No Jewelry, money, nail polish or make-up.  No lotions, powders, perfumes. No shaving  48 hrs. prior to surgery. Yes  Contacts, Dentures & Glasses Will Have to be Removed Before OR. Yes  Please bring your ID and Insurance Card the morning of your surgery. (Surgery Centers Only) Yes  Bring any papers or x-rays with you that your surgeon gave you. Yes  Instructed to contact the location of procedure/ provider if they or anyone in their household develops symptoms or tests positive for COVID-19, has close contact with someone who tests positive for COVID, or has known exposure to any contagious illness. Yes  Call this number the morning of surgery  with any problems that may cancel your surgery. [phone]  Covid-19 Assessment  Have you had a positive COVID-19 test within the previous 90 days? No  COVID Testing Guidance Proceed with the additional questions.  Patient's surgery required a COVID-19 test (cardiothoracic, complex ENT, and bronchoscopies/ EBUS) No

## 2023-02-09 MED ORDER — CHLORHEXIDINE GLUCONATE CLOTH 2 % EX PADS: 6.0000 | MEDICATED_PAD | Freq: Once | CUTANEOUS | Status: DC

## 2023-02-09 MED ORDER — ENSURE PRE-SURGERY PO LIQD: 296.0000 mL | Freq: Once | ORAL | Status: DC

## 2023-02-09 NOTE — Progress Notes (Signed)

## 2023-02-15 ENCOUNTER — Encounter (HOSPITAL_BASED_OUTPATIENT_CLINIC_OR_DEPARTMENT_OTHER): Payer: Self-pay | Admitting: General Surgery

## 2023-02-15 ENCOUNTER — Other Ambulatory Visit: Payer: Self-pay

## 2023-02-15 ENCOUNTER — Ambulatory Visit (HOSPITAL_BASED_OUTPATIENT_CLINIC_OR_DEPARTMENT_OTHER): Payer: PPO | Admitting: Anesthesiology

## 2023-02-15 ENCOUNTER — Encounter (HOSPITAL_BASED_OUTPATIENT_CLINIC_OR_DEPARTMENT_OTHER)
Admission: RE | Disposition: A | Discharge status: Home / Self Care | Payer: Self-pay | Source: Home / Self Care | Attending: General Surgery

## 2023-02-15 ENCOUNTER — Ambulatory Visit (HOSPITAL_BASED_OUTPATIENT_CLINIC_OR_DEPARTMENT_OTHER)
Admission: RE | Admit: 2023-02-15 | Discharge: 2023-02-15 | Disposition: A | Discharge status: Home / Self Care | Payer: PPO | Attending: General Surgery | Admitting: General Surgery

## 2023-02-15 DIAGNOSIS — R222 Localized swelling, mass and lump, trunk: Secondary | ICD-10-CM

## 2023-02-15 DIAGNOSIS — C50912 Malignant neoplasm of unspecified site of left female breast: Secondary | ICD-10-CM | POA: Diagnosis not present

## 2023-02-15 DIAGNOSIS — C761 Malignant neoplasm of thorax: Secondary | ICD-10-CM | POA: Diagnosis not present

## 2023-02-15 DIAGNOSIS — Z87891 Personal history of nicotine dependence: Secondary | ICD-10-CM | POA: Diagnosis not present

## 2023-02-15 DIAGNOSIS — Z800 Family history of malignant neoplasm of digestive organs: Secondary | ICD-10-CM | POA: Diagnosis not present

## 2023-02-15 HISTORY — PX: MASS EXCISION: SHX2000

## 2023-02-15 SURGERY — EXCISION MASS
Anesthesia: General | Site: Chest | Laterality: Left

## 2023-02-15 MED ORDER — DEXAMETHASONE SODIUM PHOSPHATE 10 MG/ML IJ SOLN
INTRAMUSCULAR | Status: DC | PRN
  Administered 2023-02-15: 5 mg via INTRAVENOUS

## 2023-02-15 MED ORDER — AMISULPRIDE (ANTIEMETIC) 5 MG/2ML IV SOLN: 10.0000 mg | Freq: Once | INTRAVENOUS | Status: DC | PRN

## 2023-02-15 MED ORDER — ONDANSETRON HCL 4 MG/2ML IJ SOLN: 4.0000 mg | Freq: Once | INTRAMUSCULAR | Status: DC | PRN

## 2023-02-15 MED ORDER — ONDANSETRON HCL 4 MG/2ML IJ SOLN
INTRAMUSCULAR | Status: AC
Start: 2023-02-15 — End: ?
  Filled 2023-02-15: qty 2

## 2023-02-15 MED ORDER — FENTANYL CITRATE (PF) 100 MCG/2ML IJ SOLN
INTRAMUSCULAR | Status: AC
  Filled 2023-02-15: qty 2

## 2023-02-15 MED ORDER — FENTANYL CITRATE (PF) 100 MCG/2ML IJ SOLN
25.0000 ug | INTRAMUSCULAR | Status: DC | PRN
Start: 2023-02-15 — End: 2023-02-15

## 2023-02-15 MED ORDER — LIDOCAINE HCL (CARDIAC) PF 100 MG/5ML IV SOSY
PREFILLED_SYRINGE | INTRAVENOUS | Status: DC | PRN
  Administered 2023-02-15: 40 mg via INTRAVENOUS

## 2023-02-15 MED ORDER — ACETAMINOPHEN 500 MG PO TABS
ORAL_TABLET | ORAL | Status: AC
  Filled 2023-02-15: qty 2

## 2023-02-15 MED ORDER — EPHEDRINE 5 MG/ML INJ
INTRAVENOUS | Status: AC
  Filled 2023-02-15: qty 5

## 2023-02-15 MED ORDER — CEFAZOLIN SODIUM-DEXTROSE 2-4 GM/100ML-% IV SOLN
INTRAVENOUS | Status: AC
  Filled 2023-02-15: qty 100

## 2023-02-15 MED ORDER — CEFAZOLIN SODIUM-DEXTROSE 2-4 GM/100ML-% IV SOLN
2.0000 g | INTRAVENOUS | Status: AC
  Administered 2023-02-15: 2 g via INTRAVENOUS

## 2023-02-15 MED ORDER — LIDOCAINE 2% (20 MG/ML) 5 ML SYRINGE
INTRAMUSCULAR | Status: AC
  Filled 2023-02-15: qty 5

## 2023-02-15 MED ORDER — SODIUM CHLORIDE 0.9 % IV SOLN: INTRAVENOUS | Status: DC | PRN

## 2023-02-15 MED ORDER — EPHEDRINE SULFATE (PRESSORS) 50 MG/ML IJ SOLN
INTRAMUSCULAR | Status: DC | PRN
  Administered 2023-02-15: 10 mg via INTRAVENOUS

## 2023-02-15 MED ORDER — ACETAMINOPHEN 500 MG PO TABS
1000.0000 mg | ORAL_TABLET | ORAL | Status: AC
  Administered 2023-02-15: 1000 mg via ORAL

## 2023-02-15 MED ORDER — DEXAMETHASONE SODIUM PHOSPHATE 10 MG/ML IJ SOLN
INTRAMUSCULAR | Status: AC
  Filled 2023-02-15: qty 1

## 2023-02-15 MED ORDER — PROPOFOL 10 MG/ML IV BOLUS
INTRAVENOUS | Status: DC | PRN
  Administered 2023-02-15: 100 mg via INTRAVENOUS

## 2023-02-15 MED ORDER — ONDANSETRON HCL 4 MG/2ML IJ SOLN
INTRAMUSCULAR | Status: DC | PRN
  Administered 2023-02-15: 4 mg via INTRAVENOUS

## 2023-02-15 MED ORDER — PROPOFOL 10 MG/ML IV BOLUS
INTRAVENOUS | Status: AC
Start: 2023-02-15 — End: ?
  Filled 2023-02-15: qty 20

## 2023-02-15 MED ORDER — BUPIVACAINE HCL (PF) 0.25 % IJ SOLN
INTRAMUSCULAR | Status: DC | PRN
  Administered 2023-02-15: 10 mL

## 2023-02-15 MED ORDER — FENTANYL CITRATE (PF) 100 MCG/2ML IJ SOLN
INTRAMUSCULAR | Status: DC | PRN
  Administered 2023-02-15 (×2): 25 ug via INTRAVENOUS

## 2023-02-15 MED ORDER — BUPIVACAINE HCL (PF) 0.25 % IJ SOLN
INTRAMUSCULAR | Status: AC
Start: 2023-02-15 — End: ?
  Filled 2023-02-15: qty 90

## 2023-02-15 SURGICAL SUPPLY — 51 items
ADH SKN CLS APL DERMABOND .7 (GAUZE/BANDAGES/DRESSINGS) ×1
APL PRP STRL LF DISP 70% ISPRP (MISCELLANEOUS) ×1
BLADE CLIPPER SURG (BLADE) IMPLANT
BLADE SURG 15 STRL LF DISP TIS (BLADE) ×1 IMPLANT
BLADE SURG 15 STRL SS (BLADE) ×1
CANISTER SUCT 1200ML W/VALVE (MISCELLANEOUS) IMPLANT
CHLORAPREP W/TINT 26 (MISCELLANEOUS) ×1 IMPLANT
CLSR STERI-STRIP ANTIMIC 1/2X4 (GAUZE/BANDAGES/DRESSINGS) ×1 IMPLANT
COVER BACK TABLE 60X90IN (DRAPES) ×1 IMPLANT
COVER MAYO STAND STRL (DRAPES) ×1 IMPLANT
DERMABOND ADVANCED .7 DNX12 (GAUZE/BANDAGES/DRESSINGS) ×1 IMPLANT
DRAPE LAPAROTOMY 100X72 PEDS (DRAPES) ×1 IMPLANT
DRAPE UTILITY XL STRL (DRAPES) ×1 IMPLANT
DRSG TEGADERM 4X4.75 (GAUZE/BANDAGES/DRESSINGS) IMPLANT
ELECT COATED BLADE 2.86 ST (ELECTRODE) IMPLANT
ELECT REM PT RETURN 9FT ADLT (ELECTROSURGICAL) ×1
ELECTRODE REM PT RTRN 9FT ADLT (ELECTROSURGICAL) ×1 IMPLANT
GAUZE PACKING IODOFORM 1/4X15 (PACKING) IMPLANT
GAUZE SPONGE 4X4 12PLY STRL LF (GAUZE/BANDAGES/DRESSINGS) IMPLANT
GLOVE BIO SURGEON STRL SZ7 (GLOVE) ×1 IMPLANT
GLOVE BIOGEL PI IND STRL 6.5 (GLOVE) IMPLANT
GLOVE BIOGEL PI IND STRL 7.5 (GLOVE) ×1 IMPLANT
GLOVE ECLIPSE 6.5 STRL STRAW (GLOVE) IMPLANT
GOWN STRL REUS W/ TWL LRG LVL3 (GOWN DISPOSABLE) ×3 IMPLANT
GOWN STRL REUS W/TWL LRG LVL3 (GOWN DISPOSABLE) ×3
KIT MARKER MARGIN INK (KITS) IMPLANT
NDL HYPO 25X1 1.5 SAFETY (NEEDLE) ×1 IMPLANT
NEEDLE HYPO 25X1 1.5 SAFETY (NEEDLE) ×1
NS IRRIG 1000ML POUR BTL (IV SOLUTION) IMPLANT
PACK BASIN DAY SURGERY FS (CUSTOM PROCEDURE TRAY) ×1 IMPLANT
PENCIL SMOKE EVACUATOR (MISCELLANEOUS) ×1 IMPLANT
SLEEVE SCD COMPRESS KNEE MED (STOCKING) ×1 IMPLANT
SPIKE FLUID TRANSFER (MISCELLANEOUS) IMPLANT
SPONGE T-LAP 4X18 ~~LOC~~+RFID (SPONGE) ×1 IMPLANT
STRIP CLOSURE SKIN 1/2X4 (GAUZE/BANDAGES/DRESSINGS) IMPLANT
SUT ETHILON 2 0 FS 18 (SUTURE) IMPLANT
SUT MNCRL AB 4-0 PS2 18 (SUTURE) ×1 IMPLANT
SUT SILK 2 0 SH (SUTURE) IMPLANT
SUT VIC AB 2-0 SH 27 (SUTURE) ×1
SUT VIC AB 2-0 SH 27XBRD (SUTURE) IMPLANT
SUT VIC AB 3-0 SH 27 (SUTURE) ×1
SUT VIC AB 3-0 SH 27X BRD (SUTURE) IMPLANT
SUT VIC AB 4-0 PS2 18 (SUTURE) IMPLANT
SUT VICRYL 3-0 CR8 SH (SUTURE) IMPLANT
SWAB COLLECTION DEVICE MRSA (MISCELLANEOUS) IMPLANT
SWAB CULTURE ESWAB REG 1ML (MISCELLANEOUS) IMPLANT
SYR CONTROL 10ML LL (SYRINGE) ×1 IMPLANT
TOWEL GREEN STERILE FF (TOWEL DISPOSABLE) ×1 IMPLANT
TUBE CONNECTING 20X1/4 (TUBING) IMPLANT
UNDERPAD 30X36 HEAVY ABSORB (UNDERPADS AND DIAPERS) IMPLANT
YANKAUER SUCT BULB TIP NO VENT (SUCTIONS) IMPLANT

## 2023-02-15 NOTE — H&P (Signed)
  55 yof otherwise healthy who has a chest wall mass present for several months. This has gotten bigger. No history infection or drainage. Had [place] in June that was negative. CT scan done in Sept shows focal thickening in left anterior chest wall that is 27x11 mm in size. She is here to discuss options  Review of Systems: A complete review of systems was obtained from the patient. I have reviewed this information and discussed as appropriate with the patient. See HPI as well for other Beals.  Review of Systems  All other systems reviewed and are negative.   Medical History: History reviewed. No pertinent past medical history.   Past Surgical History:  Procedure Laterality Date  TONSILLECTOMY   No Known Allergies  Current Outpatient Medications on File Prior to Visit  Medication Sig Dispense Refill  calcium carbonate 600 mg calcium (1,500 mg) Tab tablet Take by mouth  cholecalciferol, vitamin D3, (VITAMIN D3) 125 mcg (5,000 unit) tablet Take 50 mcg by mouth once daily  multivitamin tablet Take 1 tablet by mouth once daily    Family History  Problem Relation Age of Onset  High blood pressure (Hypertension) Mother  Colon cancer Father    Social History   Tobacco Use  Smoking Status Former  Types: Cigarettes  Smokeless Tobacco Not on file  Marital status: Married  Tobacco Use  Smoking status: Former  Types: Cigarettes  Vaping Use  Vaping status: Unknown  Substance and Sexual Activity  Alcohol use: Yes  Drug use: Never   Objective:   Vitals:  01/28/23 1008  BP: (!) 153/75  Pulse: 76  Temp: 36.3 C (97.3 F)  SpO2: 96%  Weight: 55.7 kg (122 lb 12.8 oz)  Height: 161.3 cm (5' 3.5")  PainSc: 0-No pain   Body mass index is 21.41 kg/m.  Physical Exam Vitals reviewed.  Constitutional:  Appearance: Normal appearance.  Chest:  Comments: 2.5x 2 cm hard mobile area Neurological:  Mental Status: She is alert.   Assessment and Plan:   Mass of chest wall,  left  Excision of left chest wall mass I am not sure exactly what this is but I am concerned about it. Does not look really like a skin lesion as it does feel considerably deeper than that. I think we could certainly consider doing a punch biopsy in the office but it does look like it might be somewhat vascular. I think in the long run this area just needs to be excised. I discussed doing excisional biopsy both to remove it as well as to get a diagnosis. We discussed risks and recovery associated with that I will plan on scheduling her soon.

## 2023-02-15 NOTE — Anesthesia Preprocedure Evaluation (Addendum)
Anesthesia Evaluation  Patient identified by MRN, date of birth, ID band Patient awake    Reviewed: Allergy & Precautions, NPO status , Patient's Chart, lab work & pertinent test results  Airway Mallampati: II  TM Distance: >3 FB Neck ROM: Full    Dental no notable dental hx.    Pulmonary former smoker   Pulmonary exam normal        Cardiovascular Normal cardiovascular exam+ Valvular Problems/Murmurs MVP      Neuro/Psych negative neurological Pearse  negative psych Enzor   GI/Hepatic negative GI Moltz, Neg liver Magowan,,,  Endo/Other  negative endocrine Lira    Renal/GU negative Renal Grieder     Musculoskeletal negative musculoskeletal Dobkins (+)    Abdominal   Peds  Hematology negative hematology Koke (+)   Anesthesia Other Findings CHEST WALL MASS  Reproductive/Obstetrics                             Anesthesia Physical Anesthesia Plan  ASA: 1  Anesthesia Plan: General   Post-op Pain Management:    Induction: Intravenous  PONV Risk Score and Plan: 3 and Ondansetron, Dexamethasone, Treatment may vary due to age or medical condition and Propofol infusion  Airway Management Planned: LMA  Additional Equipment:   Intra-op Plan:   Post-operative Plan: Extubation in OR  Informed Consent: I have reviewed the patients History and Physical, chart, labs and discussed the procedure including the risks, benefits and alternatives for the proposed anesthesia with the patient or authorized representative who has indicated his/her understanding and acceptance.     Dental advisory given  Plan Discussed with: CRNA  Anesthesia Plan Comments:        Anesthesia Quick Evaluation

## 2023-02-15 NOTE — H&P (View-Only) (Signed)
  55 yof otherwise healthy who has a chest wall mass present for several months. This has gotten bigger. No history infection or drainage. Had [place] in June that was negative. CT scan done in Sept shows focal thickening in left anterior chest wall that is 27x11 mm in size. She is here to discuss options  Review of Systems: A complete review of systems was obtained from the patient. I have reviewed this information and discussed as appropriate with the patient. See HPI as well for other Beals.  Review of Systems  All other systems reviewed and are negative.   Medical History: History reviewed. No pertinent past medical history.   Past Surgical History:  Procedure Laterality Date  TONSILLECTOMY   No Known Allergies  Current Outpatient Medications on File Prior to Visit  Medication Sig Dispense Refill  calcium carbonate 600 mg calcium (1,500 mg) Tab tablet Take by mouth  cholecalciferol, vitamin D3, (VITAMIN D3) 125 mcg (5,000 unit) tablet Take 50 mcg by mouth once daily  multivitamin tablet Take 1 tablet by mouth once daily    Family History  Problem Relation Age of Onset  High blood pressure (Hypertension) Mother  Colon cancer Father    Social History   Tobacco Use  Smoking Status Former  Types: Cigarettes  Smokeless Tobacco Not on file  Marital status: Married  Tobacco Use  Smoking status: Former  Types: Cigarettes  Vaping Use  Vaping status: Unknown  Substance and Sexual Activity  Alcohol use: Yes  Drug use: Never   Objective:   Vitals:  01/28/23 1008  BP: (!) 153/75  Pulse: 76  Temp: 36.3 C (97.3 F)  SpO2: 96%  Weight: 55.7 kg (122 lb 12.8 oz)  Height: 161.3 cm (5' 3.5")  PainSc: 0-No pain   Body mass index is 21.41 kg/m.  Physical Exam Vitals reviewed.  Constitutional:  Appearance: Normal appearance.  Chest:  Comments: 2.5x 2 cm hard mobile area Neurological:  Mental Status: She is alert.   Assessment and Plan:   Mass of chest wall,  left  Excision of left chest wall mass I am not sure exactly what this is but I am concerned about it. Does not look really like a skin lesion as it does feel considerably deeper than that. I think we could certainly consider doing a punch biopsy in the office but it does look like it might be somewhat vascular. I think in the long run this area just needs to be excised. I discussed doing excisional biopsy both to remove it as well as to get a diagnosis. We discussed risks and recovery associated with that I will plan on scheduling her soon.

## 2023-02-15 NOTE — Discharge Instructions (Addendum)
Post Anesthesia Home Care Instructions  Activity: Get plenty of rest for the remainder of the day. A responsible individual must stay with you for 24 hours following the procedure.  For the next 24 hours, DO NOT: -Drive a car -Advertising copywriter -Drink alcoholic beverages -Take any medication unless instructed by your physician -Make any legal decisions or sign important papers.  Meals: Start with liquid foods such as gelatin or soup. Progress to regular foods as tolerated. Avoid greasy, spicy, heavy foods. If nausea and/or vomiting occur, drink only clear liquids until the nausea and/or vomiting subsides. Call your physician if vomiting continues.  Special Instructions/Symptoms: Your throat may feel dry or sore from the anesthesia or the breathing tube placed in your throat during surgery. If this causes discomfort, gargle with warm salt water. The discomfort should disappear within 24 hours.  If you had a scopolamine patch placed behind your ear for the management of post- operative nausea and/or vomiting:  1. The medication in the patch is effective for 72 hours, after which it should be removed.  Wrap patch in a tissue and discard in the trash. Wash hands thoroughly with soap and water. 2. You may remove the patch earlier than 72 hours if you experience unpleasant side effects which may include dry mouth, dizziness or visual disturbances. 3. Avoid touching the patch. Wash your hands with soap and water after contact with the patch.    Next dose of Tylenol may be taken at [facility] Office Phone Number [phone]  POST OP INSTRUCTIONS Take 400 mg of ibuprofen every 8 hours or 650 mg tylenol every 6 hours for next 72 hours then as needed. Use ice several times daily also.  Take your usually prescribed medications unless otherwise directed You should eat light the first 24 hours after surgery, such as soup, crackers, pudding, etc.  Resume your normal diet  the day after surgery. Most patients will experience some swelling and bruising.  Ice packs  will help.  Swelling and bruising can take several days to resolve.  It is common to experience some constipation if taking pain medication after surgery.  Increasing fluid intake and taking a stool softener will usually help or prevent this problem from occurring.  A mild laxative (Milk of Magnesia or Miralax) should be taken according to package directions if there are no bowel movements after 48 hours. I used skin glue on the incision, you may shower in 24 hours.  The glue will flake off over the next 2-3 weeks.  Any sutures or staples will be removed at the office during your follow-up visit. ACTIVITIES:  You may resume regular daily activities (gradually increasing) beginning the next day.   You may drive when you no longer are taking prescription pain medication, you can comfortably wear a seatbelt, and you can safely maneuver your car and apply brakes. RETURN TO WORK:  ______________________________________________________________________________________ Becky Ramirez should see your doctor in the office for a follow-up appointment approximately 2-3 weeks after your surgery.  Your doctor's nurse will typically make your follow-up appointment when she calls you with your pathology report.  Expect your pathology report 3-4 business days after your surgery.  You may call to check if you do not hear from [place] after three days. OTHER INSTRUCTIONS: _______________________________________________________________________________________________  _____________________________________________________________________________________________________________________________________ _____________________________________________________________________________________________________________________________________ _____________________________________________________________________________________________________________________________________  WHEN TO CALL DR WAKEFIELD: Fever over 101.0 Nausea and/or vomiting. Extreme swelling or bruising. Continued bleeding from incision. Increased pain, redness, or drainage from the incision.  The clinic staff  is available to answer your questions during regular business hours.  Please don't hesitate to call and ask to speak to one of the nurses for clinical concerns.  If you have a medical emergency, go to the nearest emergency room or call 911.  A surgeon from [facility] is always on call at the hospital.  For further questions, please visit [email or web address]

## 2023-02-15 NOTE — Interval H&P Note (Signed)
History and Physical Interval Note:  02/15/2023 7:12 AM  Becky Ramirez  has presented today for surgery, with the diagnosis of CHEST WALL MASS.  The various methods of treatment have been discussed with the patient and family. After consideration of risks, benefits and other options for treatment, the patient has consented to  Procedure(s) with comments: EXCISION CHEST WALL MASS (N/A) - GEN LMA as a surgical intervention.  The patient's history has been reviewed, patient examined, no change in status, stable for surgery.  I have reviewed the patient's chart and labs.  Questions were answered to the patient's satisfaction.     [name]

## 2023-02-15 NOTE — Op Note (Signed)
Preoperative diagnosis: Left chest wall mass Postoperative diagnosis: Same as above Procedure: Excision of subcutaneous 3 x 2 cm left chest wall mass Surgeon: Dr. Harden Mo Anesthesia: General Specimens: Left chest wall mass marked short superior, long lateral Estimated blood loss: Minimal Complications: None Drains: None Sponge needle count was correct completion Disposition recovery in stable condition  Indications:86 yof otherwise healthy who has a chest wall mass present for several months. This has gotten bigger. No history infection or drainage. Had [place] in June that was negative. CT scan done in Sept shows focal thickening in left anterior chest wall that is 27x11 mm in size.  I am not entirely sure what this is on exam.  We discussed excising it in the operating room as soon as possible.  Procedure: After informed consent was obtained she was taken to the operating room.  She was given antibiotics.  SCDs were placed.  She was placed under general anesthesia without complication.  She was prepped and draped in a standard sterile surgical fashion.  A surgical timeout was then performed.  I infiltrated Marcaine around this area.  I then made an elliptical incision to encompass the mass and a rim of normal tissue as well as the skin overlying it.  I then took this down to the fascia.  The mass did extend to the fascia.  The mass was hard.  This may be a benign mass but I was concerned there could be something else as well.  I then was able to remove this in its entirety.  I did not enter into the mass.  I then passed this off the table as a specimen.  I then obtained hemostasis.  I then closed this with 2-0 Vicryl, 3-0 Vicryl, and 4-0 Monocryl.  Glue and Steri-Strips were applied.  She tolerated this well was transferred recovery stable.

## 2023-02-15 NOTE — Anesthesia Procedure Notes (Signed)
Procedure Name: LMA Insertion Date/Time: 02/15/2023 7:34 AM  Performed by: Thornell Mule, CRNAPre-anesthesia Checklist: Patient identified, Emergency Drugs available, Suction available and Patient being monitored Patient Re-evaluated:Patient Re-evaluated prior to induction Oxygen Delivery Method: Circle system utilized Preoxygenation: Pre-oxygenation with 100% oxygen Induction Type: IV induction LMA: LMA inserted LMA Size: 4.0 Number of attempts: 1 Placement Confirmation: positive ETCO2 Tube secured with: Tape Dental Injury: Teeth and Oropharynx as per pre-operative assessment

## 2023-02-15 NOTE — Transfer of Care (Signed)
Immediate Anesthesia Transfer of Care Note  Patient: Becky Ramirez  Procedure(s) Performed: EXCISION CHEST WALL MASS (Left: Chest)  Patient Location: PACU  Anesthesia Type:General  Level of Consciousness: drowsy, patient cooperative, and responds to stimulation  Airway & Oxygen Therapy: Patient Spontanous Breathing and Patient connected to face mask oxygen  Post-op Assessment: Report given to RN and Post -op Vital signs reviewed and stable  Post vital signs: Reviewed and stable  Last Vitals:  Vitals Value Taken Time  BP    Temp    Pulse 81 02/15/23 0815  Resp 15 02/15/23 0815  SpO2 100 % 02/15/23 0815  Vitals shown include unfiled device data.  Last Pain:  Vitals:   02/15/23 4098  TempSrc: Temporal  PainSc: 0-No pain         Complications: No notable events documented.

## 2023-02-15 NOTE — Anesthesia Postprocedure Evaluation (Signed)
Anesthesia Post Note  Patient: Becky Ramirez  Procedure(s) Performed: EXCISION CHEST WALL MASS (Left: Chest)     Patient location during evaluation: PACU Anesthesia Type: General Level of consciousness: awake Pain management: pain level controlled Vital Signs Assessment: post-procedure vital signs reviewed and stable Respiratory status: spontaneous breathing, nonlabored ventilation and respiratory function stable Cardiovascular status: blood pressure returned to baseline and stable Postop Assessment: no apparent nausea or vomiting Anesthetic complications: no   No notable events documented.  Last Vitals:  Vitals:   02/15/23 0900 02/15/23 0917  BP: 134/62 (!) 160/68  Pulse: 71 75  Resp: 18 18  Temp:  (!) 36.3 C  SpO2: 97% 96%    Last Pain:  Vitals:   02/15/23 0917  TempSrc: Temporal  PainSc: 0-No pain                 Velita Quirk P Necha Harries

## 2023-02-16 ENCOUNTER — Encounter (HOSPITAL_BASED_OUTPATIENT_CLINIC_OR_DEPARTMENT_OTHER): Payer: Self-pay | Admitting: General Surgery

## 2023-02-19 ENCOUNTER — Other Ambulatory Visit: Payer: Self-pay | Admitting: General Surgery

## 2023-02-19 DIAGNOSIS — D0502 Lobular carcinoma in situ of left breast: Secondary | ICD-10-CM

## 2023-02-22 LAB — SURGICAL PATHOLOGY

## 2023-02-24 ENCOUNTER — Ambulatory Visit
Admission: RE | Admit: 2023-02-24 | Discharge: 2023-02-24 | Disposition: A | Discharge status: Home / Self Care | Payer: PPO | Source: Ambulatory Visit | Attending: General Surgery | Admitting: General Surgery

## 2023-02-24 ENCOUNTER — Telehealth: Payer: Self-pay | Admitting: Radiation Oncology

## 2023-02-24 DIAGNOSIS — R222 Localized swelling, mass and lump, trunk: Secondary | ICD-10-CM | POA: Diagnosis not present

## 2023-02-24 DIAGNOSIS — D0502 Lobular carcinoma in situ of left breast: Secondary | ICD-10-CM

## 2023-02-24 NOTE — Telephone Encounter (Signed)
Called patient to schedule a consultation w. Dr. Kinard. No answer, LVM for a return call.  

## 2023-02-25 NOTE — Progress Notes (Signed)
Location of Breast Cancer:left chest wall  Histology per Pathology Report:     Receptor Status: ER(90% positive), PR (10% postive), Her2-neu (negative), Ki-67(35%)  Did patient present with symptoms (if so, please note symptoms) or was this found on screening mammography?: No, patient reported that she found it.  Past/Anticipated interventions by surgeon, if JXB:JYNW     Past/Anticipated interventions by medical oncology, if any: None  Lymphedema issues, if any:  No   Pain issues, if any:  No   SAFETY ISSUES: Prior radiation? No Pacemaker/ICD? No Possible current pregnancy?No Is the patient on methotrexate? No   BP (!) 144/71   Pulse 72   Temp (!) 97.2 F (36.2 C)   Resp 18   Wt 121 lb 9.6 oz (55.2 kg)   SpO2 98%   BMI 21.54 kg/m

## 2023-02-26 ENCOUNTER — Ambulatory Visit (HOSPITAL_BASED_OUTPATIENT_CLINIC_OR_DEPARTMENT_OTHER): Payer: PPO | Admitting: Student

## 2023-02-26 ENCOUNTER — Encounter (HOSPITAL_BASED_OUTPATIENT_CLINIC_OR_DEPARTMENT_OTHER): Payer: Self-pay | Admitting: Student

## 2023-02-26 ENCOUNTER — Ambulatory Visit (HOSPITAL_BASED_OUTPATIENT_CLINIC_OR_DEPARTMENT_OTHER): Payer: PPO

## 2023-02-26 DIAGNOSIS — M7741 Metatarsalgia, right foot: Secondary | ICD-10-CM | POA: Diagnosis not present

## 2023-02-26 DIAGNOSIS — M19071 Primary osteoarthritis, right ankle and foot: Secondary | ICD-10-CM | POA: Diagnosis not present

## 2023-02-26 DIAGNOSIS — M7731 Calcaneal spur, right foot: Secondary | ICD-10-CM | POA: Diagnosis not present

## 2023-02-26 DIAGNOSIS — M79671 Pain in right foot: Secondary | ICD-10-CM

## 2023-02-26 NOTE — Progress Notes (Signed)
Chief Complaint: Right foot pain     History of Present Illness:    Becky Ramirez is a 87 y.o. female presenting today with 2-day history of right foot pain.  Patient denies any specific injury.  She states that the pain is located mainly over the lateral foot around the base of toes 3 through 5.  She rates pain today at a 2/10 which is made worse with weightbearing and walking.  Does not have pain while nonweightbearing.  Denies any redness, numbness, or tingling.  She has tried ice and Tylenol.  She does enjoy exercising frequently and attends classes at the [facility].   Surgical History:   None  PMH/PSH/Family History/Social History/Meds/Allergies:    Past Medical History:  Diagnosis Date   Allergy    MVP (mitral valve prolapse)    Osteoporosis    Past Surgical History:  Procedure Laterality Date   MASS EXCISION Left 02/15/2023   Procedure: EXCISION CHEST WALL MASS;  Surgeon: [name], MD;  Location: [facility];  Service: General;  Laterality: Left;  GEN LMA   TONSILLECTOMY     87 years old   Social History   Socioeconomic History   Marital status: Married    Spouse name: Not on file   Number of children: Not on file   Years of education: Not on file   Highest education level: Not on file  Occupational History   Not on file  Tobacco Use   Smoking status: Former    Current packs/day: 0.00    Types: Cigarettes    Quit date: 1980    Years since quitting: 44.9   Smokeless tobacco: Never  Vaping Use   Vaping status: Never Used  Substance and Sexual Activity   Alcohol use: Yes    Comment: Social drinker 2-3 times weekly   Drug use: Never   Sexual activity: Not on file  Other Topics Concern   Not on file  Social History Narrative   Not on file   Social Determinants of Health   Financial Resource Strain: Not on file  Food Insecurity: Not on file  Transportation Needs: Not on file  Physical Activity: Not on  file  Stress: Not on file  Social Connections: Not on file   Family History  Problem Relation Age of Onset   Cancer Mother    Hypertension Mother    Cancer Father    No Known Allergies Current Outpatient Medications  Medication Sig Dispense Refill   CALCIUM PO Take 1 tablet by mouth daily.     cetirizine (ZYRTEC) 10 MG tablet Take 10 mg by mouth at bedtime.     denosumab (PROLIA) 60 MG/ML SOSY injection Inject 60 mg into the skin every 6 (six) months.     Multiple Vitamin (MULTIVITAMIN) tablet Take 1 tablet by mouth daily.     VITAMIN D PO Take 1 capsule by mouth daily.     No current facility-administered medications for this visit.   No results found.  Review of Systems:   A Cundari was performed including pertinent positives and negatives as documented in the HPI.  Physical Exam :   Constitutional: NAD and appears stated age Neurological: Alert and oriented Psych: Appropriate affect and cooperative There were no vitals taken for this visit.   Comprehensive Musculoskeletal Exam:  There is notable splaying of the second and third digits which she states occurred due to an injury in childhood.  No evidence of erythema or ecchymosis of the right foot.  Mild tenderness palpation over the dorsal and plantar aspect of the 3rd through 5th metatarsal heads.  Negative squeeze test.  No tenderness at the base of the heel or along the plantar fascia.  Imaging:   Xray (right foot 3 views): No evidence of acute bony abnormality or notable osteoarthritis.  Splaying of second and third toes due to previous injury from childhood.   I personally reviewed and interpreted the radiographs.   Assessment:   87 y.o. female with acute lateral right foot pain.  No specific injury but she is very active.  There is not any evidence on today's x-rays to suggest a fracture, stress fracture, or arthritis as a cause of her pain.  Unable to completely rule out Morton's neuroma however no palpable mass  and negative squeeze test.  She has gotten a little bit of relief so far with stretching of the foot.  I do recommend continuing this as well as ice and NSAIDs such as Voltaren gel.  Can continue to rest and utilize supportive footwear.  I will plan to see her back as needed should her symptoms persist.  Plan :    -Return to clinic as needed     I personally saw and evaluated the patient, and participated in the management and treatment plan.  Hazle Nordmann, PA-C Orthopedics

## 2023-03-01 ENCOUNTER — Encounter: Payer: Self-pay | Admitting: Radiation Oncology

## 2023-03-01 ENCOUNTER — Ambulatory Visit
Admission: RE | Admit: 2023-03-01 | Discharge: 2023-03-01 | Disposition: A | Discharge status: Home / Self Care | Payer: PPO | Source: Ambulatory Visit | Attending: Radiation Oncology | Admitting: Radiation Oncology

## 2023-03-01 VITALS — BP 144/71 | HR 72 | Temp 97.2°F | Resp 18 | Wt 121.6 lb

## 2023-03-01 DIAGNOSIS — I341 Nonrheumatic mitral (valve) prolapse: Secondary | ICD-10-CM | POA: Insufficient documentation

## 2023-03-01 DIAGNOSIS — D0502 Lobular carcinoma in situ of left breast: Secondary | ICD-10-CM | POA: Diagnosis not present

## 2023-03-01 DIAGNOSIS — Z87891 Personal history of nicotine dependence: Secondary | ICD-10-CM | POA: Diagnosis not present

## 2023-03-01 DIAGNOSIS — Z809 Family history of malignant neoplasm, unspecified: Secondary | ICD-10-CM | POA: Diagnosis not present

## 2023-03-01 DIAGNOSIS — M810 Age-related osteoporosis without current pathological fracture: Secondary | ICD-10-CM | POA: Insufficient documentation

## 2023-03-01 DIAGNOSIS — Z170 Estrogen receptor positive status [ER+]: Secondary | ICD-10-CM | POA: Diagnosis not present

## 2023-03-01 DIAGNOSIS — C50912 Malignant neoplasm of unspecified site of left female breast: Secondary | ICD-10-CM | POA: Insufficient documentation

## 2023-03-01 NOTE — Progress Notes (Signed)
Radiation Oncology         [phone] ________________________________  Initial Outpatient Consultation  Name: Becky Ramirez MRN: [id number]  Date: 03/01/2023  DOB: 1936-02-17  YN:[id number], Onalee Hua, MD  [name], MD   REFERRING PHYSICIAN: [name], MD  DIAGNOSIS: The encounter diagnosis was Lobular carcinoma in situ (LCIS) of left breast.  Left Chest Wall Mass - Invasive and in situ lobular carcinoma, ER+ / PR+ / Her2-, Grade 2  HISTORY OF PRESENT ILLNESS::Becky Ramirez is a 87 y.o. female who is accompanied by her son. she is seen as a courtesy of Dr. Dwain Sarna for an opinion concerning radiation therapy as part of management for her recently diagnosed invasive lobular carcinoma presenting in a left chest wall mass.   The patient intially presented to her PCP this past Summer with an increasing chest wall mass. To further evaluate the mass, she presented for a soft tissue US of the chest on 09/15/22 which showed no abnormalities. Due to persistence of the mass, she later presented for a chest CT on 12/30/22 which showed a focal area of skin thickening in the left anterior chest wall corresponding to the palpable area, and an indeterminate irregular density measuring 2.7 cm in the subcutaneous tissues immediately inferior to the area skin thickening.  She was subsequently referred to Dr. Dwain Sarna and underwent excision on the left chest wall mass on 02/15/23. Pathology showed findings consistent with grade 2 invasive lobular carcinoma measuring 3 cm in the greatest extent, with LCIS; deep margin and dermis of the overlying skin positive for carcinoma; negative for LVI or PNI. Prognostic indicators significant for: estrogen receptor 90% positive and progesterone receptor 10% positive, both with strong staining intensity; Proliferation marker Ki67 at 35%; Her2 status negative; Grade 2.   She has discussed further surgical options moving forward with Dr. Dwain Sarna and has opted  to proceed with re-excision to obtain negative margins.   To complete her staging work-up, she recently presented for a left breast diagnostic mammogram and left breast ultrasound on 02/24/23 which showed: expected surgical changes in the lower left chest at the site of her recent excision (without any visibile abnormalities in the area). Imaging otherwise showed no evidence of left axillary lymphadenopathy or evidence of malignancy within the left breast. Of note: the area involving the surgically removed left chest wall invasive lobular carcinoma was not able to be mammographically imaged due to its far inferior location.  She is scheduled to meet with Dr. Pamelia Hoit in consultation on 03/03/23 to discuss systemic treatment.   PREVIOUS RADIATION THERAPY: No  PAST MEDICAL HISTORY:  Past Medical History:  Diagnosis Date   Allergy    MVP (mitral valve prolapse)    Osteoporosis     PAST SURGICAL HISTORY: Past Surgical History:  Procedure Laterality Date   MASS EXCISION Left 02/15/2023   Procedure: EXCISION CHEST WALL MASS;  Surgeon: [name], MD;  Location: [facility];  Service: General;  Laterality: Left;  GEN LMA   TONSILLECTOMY     87 years old    FAMILY HISTORY:  Family History  Problem Relation Age of Onset   Cancer Mother    Hypertension Mother    Cancer Father     SOCIAL HISTORY:  Social History   Tobacco Use   Smoking status: Former    Current packs/day: 0.00    Types: Cigarettes    Quit date: 1980    Years since quitting: 44.9   Smokeless tobacco: Never  Vaping  Use   Vaping status: Never Used  Substance Use Topics   Alcohol use: Yes    Comment: Social drinker 2-3 times weekly   Drug use: Never    ALLERGIES: No Known Allergies  MEDICATIONS:  Current Outpatient Medications  Medication Sig Dispense Refill   CALCIUM PO Take 1 tablet by mouth daily.     cetirizine (ZYRTEC) 10 MG tablet Take 10 mg by mouth at bedtime.     denosumab  (PROLIA) 60 MG/ML SOSY injection Inject 60 mg into the skin every 6 (six) months.     Multiple Vitamin (MULTIVITAMIN) tablet Take 1 tablet by mouth daily.     VITAMIN D PO Take 1 capsule by mouth daily.     No current facility-administered medications for this encounter.    REVIEW OF SYSTEMS:  A 10+ POINT REVIEW OF SYSTEMS WAS OBTAINED including neurology, dermatology, psychiatry, cardiac, respiratory, lymph, extremities, GI, GU, musculoskeletal, constitutional, reproductive, HEENT.  Prior to diagnosis she denies any nipple discharge or bleeding from either breast.  She denied any pain in this area but did notice the this area on self-exam.  She reports the right nipple to be chronically inverted.   PHYSICAL EXAM:  weight is 121 lb 9.6 oz (55.2 kg). Her temperature is 97.2 F (36.2 C) (abnormal). Her blood pressure is 144/71 (abnormal) and her pulse is 72. Her respiration is 18 and oxygen saturation is 98%.   General: Alert and oriented, in no acute distress HEENT: Head is normocephalic. Extraocular movements are intact.  Neck: Neck is supple, no palpable cervical or supraclavicular lymphadenopathy. Heart: Regular in rate and rhythm with no murmurs, rubs, or gallops. Chest: Clear to auscultation bilaterally, with no rhonchi, wheezes, or rales. Abdomen: Soft, nontender, nondistended, with no rigidity or guarding. Extremities: No cyanosis or edema. Lymphatics: see Neck Exam Skin: No concerning lesions. Musculoskeletal: symmetric strength and muscle tone throughout. Neurologic: Cranial nerves II through XII are grossly intact. No obvious focalities. Speech is fluent. Coordination is intact. Psychiatric: Judgment and insight are intact. Affect is appropriate.  Right Chest/Breast: no palpable mass, nipple discharge or bleeding.  Some thickening noted in the lateral aspect of the right breast but no dominant mass.  The right nipple is chronically inverted.  No nipple discharge or bleeding. Left  Chest/Breast: No palpable mass nipple discharge or bleeding.  In the inframammary fold the patient has a long horizontal scar which is healing well without signs of drainage or infection.  ECOG = 1  0 - Asymptomatic (Fully active, able to carry on all predisease activities without restriction)  1 - Symptomatic but completely ambulatory (Restricted in physically strenuous activity but ambulatory and able to carry out work of a light or sedentary nature. For example, light housework, office work)  2 - Symptomatic, <50% in bed during the day (Ambulatory and capable of all self care but unable to carry out any work activities. Up and about more than 50% of waking hours)  3 - Symptomatic, >50% in bed, but not bedbound (Capable of only limited self-care, confined to bed or chair 50% or more of waking hours)  4 - Bedbound (Completely disabled. Cannot carry on any self-care. Totally confined to bed or chair)  5 - Death   Santiago Glad MM, Creech RH, Tormey DC, et al. [phone]). "Toxicity and response criteria of the [facility]". Am. Evlyn Clines. Oncol. 5 (6): 649-55  LABORATORY DATA:  Lab Results  Component Value Date   WBC 3.8 (L) 04/21/2018  HGB 12.7 04/21/2018   HCT 39.2 04/21/2018   MCV 93.6 04/21/2018   PLT 174 04/21/2018   NEUTROABS 2.1 04/21/2018   Lab Results  Component Value Date   NA 141 04/21/2018   K 4.8 04/21/2018   CL 107 04/21/2018   CO2 28 04/21/2018   GLUCOSE 105 (H) 04/21/2018   BUN 12 04/21/2018   CREATININE 0.75 04/21/2018   CALCIUM 9.1 04/21/2018      RADIOGRAPHY: MM 3D DIAGNOSTIC MAMMOGRAM UNILATERAL LEFT BREAST  Result Date: 02/24/2023 CLINICAL DATA:  87 year old female with recent LOWER LEFT chest wall mass (slightly below the LEFT breast) with surgical excision on 02/15/2023 demonstrating invasive lobular carcinoma with positive margin. EXAM: DIGITAL DIAGNOSTIC UNILATERAL LEFT MAMMOGRAM WITH TOMOSYNTHESIS AND CAD; ULTRASOUND LEFT BREAST LIMITED  TECHNIQUE: Left digital diagnostic mammography and breast tomosynthesis was performed. The images were evaluated with computer-aided detection. ; Targeted ultrasound examination of the left breast was performed. COMPARISON:  Previous exam(s). ACR Breast Density Category b: There are scattered areas of fibroglandular density. FINDINGS: Full field and magnification views of the LEFT breast demonstrate no suspicious mass, distortion or worrisome calcifications. The surgical site is not visible mammographically. Targeted ultrasound is performed, showing surgical changes within the LOWER LEFT chest below the LEFT breast. No abnormal LEFT axillary lymph nodes are noted. IMPRESSION: 1. Surgical changes within the LOWER LEFT chest at the site of recent surgery. No gross suspicious abnormality in this area. No abnormal LEFT axillary lymph nodes. 2. No evidence of malignancy within the visualized LEFT breast. Please note that the area involving the surgically removed LEFT chest wall invasive lobular carcinoma is not able to be mammographically imaged due to far INFERIOR location. RECOMMENDATION: Treatment plan I have discussed the findings and recommendations with the patient. If applicable, a reminder letter will be sent to the patient regarding the next appointment. BI-RADS CATEGORY  6: Known biopsy-proven malignancy. Electronically Signed   By: Harmon Pier M.D.   On: 02/24/2023 11:39   [place] LIMITED ULTRASOUND INCLUDING AXILLA LEFT BREAST   Result Date: 02/24/2023 CLINICAL DATA:  87 year old female with recent LOWER LEFT chest wall mass (slightly below the LEFT breast) with surgical excision on 02/15/2023 demonstrating invasive lobular carcinoma with positive margin. EXAM: DIGITAL DIAGNOSTIC UNILATERAL LEFT MAMMOGRAM WITH TOMOSYNTHESIS AND CAD; ULTRASOUND LEFT BREAST LIMITED TECHNIQUE: Left digital diagnostic mammography and breast tomosynthesis was performed. The images were evaluated with computer-aided detection. ;  Targeted ultrasound examination of the left breast was performed. COMPARISON:  Previous exam(s). ACR Breast Density Category b: There are scattered areas of fibroglandular density. FINDINGS: Full field and magnification views of the LEFT breast demonstrate no suspicious mass, distortion or worrisome calcifications. The surgical site is not visible mammographically. Targeted ultrasound is performed, showing surgical changes within the LOWER LEFT chest below the LEFT breast. No abnormal LEFT axillary lymph nodes are noted. IMPRESSION: 1. Surgical changes within the LOWER LEFT chest at the site of recent surgery. No gross suspicious abnormality in this area. No abnormal LEFT axillary lymph nodes. 2. No evidence of malignancy within the visualized LEFT breast. Please note that the area involving the surgically removed LEFT chest wall invasive lobular carcinoma is not able to be mammographically imaged due to far INFERIOR location. RECOMMENDATION: Treatment plan I have discussed the findings and recommendations with the patient. If applicable, a reminder letter will be sent to the patient regarding the next appointment. BI-RADS CATEGORY  6: Known biopsy-proven malignancy. Electronically Signed   By: Henrietta Hoover.D.  On: 02/24/2023 11:39      IMPRESSION:  Left Chest Wall Mass - Invasive and in situ lobular carcinoma, ER+ / PR+ / Her2-, Grade 2  She was found to have a palpable mobile mass inferior to the left breast near the inframammary fold.  Pathologic findings revealed this is invasive lobular breast cancer.  Clinical impression is that she has breast cancer that has developed an ectopic breast tissue.  Would agree with plans for reexcision to clear margins.  Anticipate that she would benefit from adjuvant hormonal therapy and she will meet with Dr. Pamelia Hoit later this week to discuss.  Would also consider radiation therapy to the left breast and surgical bed to reduce the chances for local regional recurrence  especially given the size of the lesion.  Also discussed that it may be reasonable for her to proceed with adjuvant hormonal therapy alone after her surgery given her age of 76.  Then to consider radiation if she has recurrence.  I discussed the overall course of treatment anticipated side effects and potential long-term toxicities of radiation therapy in this situation.  She appears to understand and wishes to proceed with radiation therapy if that is the consensus recommendation from her physicians and breast conference.  PLAN: She will be seen approximately a week after her reexcision to further discuss and plan any potential treatment.   60 minutes of total time was spent for this patient encounter, including preparation, face-to-face counseling with the patient and coordination of care, physical exam, and documentation of the encounter.   ------------------------------------------------  Billie Lade, PhD, MD  This document serves as a record of services personally performed by Antony Blackbird, MD. It was created on his behalf by Neena Rhymes, a trained medical scribe. The creation of this record is based on the scribe's personal observations and the provider's statements to them. This document has been checked and approved by the attending provider.

## 2023-03-02 ENCOUNTER — Ambulatory Visit (HOSPITAL_BASED_OUTPATIENT_CLINIC_OR_DEPARTMENT_OTHER): Payer: PPO | Admitting: Student

## 2023-03-02 ENCOUNTER — Encounter (HOSPITAL_COMMUNITY): Payer: Self-pay | Admitting: General Surgery

## 2023-03-02 ENCOUNTER — Other Ambulatory Visit: Payer: Self-pay | Admitting: General Surgery

## 2023-03-02 ENCOUNTER — Other Ambulatory Visit: Payer: Self-pay

## 2023-03-02 ENCOUNTER — Encounter (HOSPITAL_BASED_OUTPATIENT_CLINIC_OR_DEPARTMENT_OTHER): Payer: Self-pay | Admitting: Student

## 2023-03-02 DIAGNOSIS — M7741 Metatarsalgia, right foot: Secondary | ICD-10-CM | POA: Diagnosis not present

## 2023-03-02 NOTE — Progress Notes (Signed)
Chief Complaint: Right foot pain     History of Present Illness:   03/02/23: Patient is here today for follow up of her right foot.  She reports that her pain has continued to worsen and is now moderate in severity.  Her pain is significantly exacerbated with weight bearing particularly when pushing off of the right foot during gait.  She has been walking with her right foot pronated to avoid weight on the lateral foot.  Has been icing, elevating, and using a compressive wrap.   02/26/23: Becky Ramirez is a 87 y.o. female presenting today with 2-day history of right foot pain.  Patient denies any specific injury.  She states that the pain is located mainly over the lateral foot around the base of toes 3 through 5.  She rates pain today at a 2/10 which is made worse with weightbearing and walking.  Does not have pain while nonweightbearing.  Denies any redness, numbness, or tingling.  She has tried ice and Tylenol.  She does enjoy exercising frequently and attends classes at the [facility].   Surgical History:   None  PMH/PSH/Family History/Social History/Meds/Allergies:    Past Medical History:  Diagnosis Date   Allergy    MVP (mitral valve prolapse)    Osteoporosis    Past Surgical History:  Procedure Laterality Date   MASS EXCISION Left 02/15/2023   Procedure: EXCISION CHEST WALL MASS;  Surgeon: [name], MD;  Location: [facility];  Service: General;  Laterality: Left;  GEN LMA   TONSILLECTOMY     87 years old   Social History   Socioeconomic History   Marital status: Married    Spouse name: Not on file   Number of children: Not on file   Years of education: Not on file   Highest education level: Not on file  Occupational History   Not on file  Tobacco Use   Smoking status: Former    Current packs/day: 0.00    Types: Cigarettes    Quit date: 1980    Years since quitting: 44.9   Smokeless tobacco: Never  Vaping Use    Vaping status: Never Used  Substance and Sexual Activity   Alcohol use: Yes    Comment: Social drinker 2-3 times weekly   Drug use: Never   Sexual activity: Not on file  Other Topics Concern   Not on file  Social History Narrative   Not on file   Social Determinants of Health   Financial Resource Strain: Not on file  Food Insecurity: No Food Insecurity (03/01/2023)   Hunger Vital Sign    Worried About Running Out of Food in the Last Year: Never true    Ran Out of Food in the Last Year: Never true  Transportation Needs: No Transportation Needs (03/01/2023)   PRAPARE - Administrator, Civil Service (Medical): No    Lack of Transportation (Non-Medical): No  Physical Activity: Not on file  Stress: Not on file  Social Connections: Not on file   Family History  Problem Relation Age of Onset   Cancer Mother    Hypertension Mother    Cancer Father    No Known Allergies Current Outpatient Medications  Medication Sig Dispense Refill   CALCIUM PO Take 1 tablet by mouth daily.  cetirizine (ZYRTEC) 10 MG tablet Take 10 mg by mouth at bedtime.     denosumab (PROLIA) 60 MG/ML SOSY injection Inject 60 mg into the skin every 6 (six) months.     Multiple Vitamin (MULTIVITAMIN) tablet Take 1 tablet by mouth daily.     VITAMIN D PO Take 1 capsule by mouth daily.     No current facility-administered medications for this visit.   No results found.  Review of Systems:   A Chaplin was performed including pertinent positives and negatives as documented in the HPI.  Physical Exam :   Constitutional: NAD and appears stated age Neurological: Alert and oriented Psych: Appropriate affect and cooperative There were no vitals taken for this visit.   Comprehensive Musculoskeletal Exam:    Patient walking with antalgic gait and pronation of the right foot.  There is notable splaying of the second and third digits which she states occurred due to an injury in childhood.  No evidence  of erythema or ecchymosis of the right foot.  Mild tenderness to palpation over the dorsal and plantar aspect of the 3rd through 5th metatarsal heads.  Negative squeeze test.  No tenderness at the base of the heel or along the plantar fascia.  Imaging:   Xray (right foot 3 views): No evidence of acute bony abnormality or notable osteoarthritis.  Splaying of second and third toes due to previous injury from childhood.   I personally reviewed and interpreted the radiographs.   Assessment:   87 y.o. female with continued pain around the 3rd through 5th metatarsal heads of the right foot.  Weightbearing has become more difficult over the last few days due to the pain.  She does not have any pinpoint tenderness or palpable mass over this area on exam.  I would like to limit MTP flexion with a postop shoe as this is currently exacerbating her symptoms.  Also recommend follow-up with a foot specialist for further evaluation so I will plan to refer to Dr. Lajoyce Corners.  She is set to undergo breast cancer surgery later this week followed by treatments and her goal is to be able to get outside to walk without pain.  Plan :    -Placed in post op shoe today -Referral to Dr. Lajoyce Corners for further evaluation     I personally saw and evaluated the patient, and participated in the management and treatment plan.  Becky Nordmann, PA-C Orthopedics

## 2023-03-02 NOTE — Progress Notes (Signed)
PCP - Tally Joe, MD  Cardiologist - denies  PPM/ICD - denies   Chest x-ray - many years ago per pt EKG - denies Stress Test - denies ECHO - denies Cardiac Cath - denies  CPAP - denies  DM- denies  ASA/Blood Thinner Instructions: n/a   ERAS Protcol - clears until 0430  COVID TEST- n/a  Anesthesia review: no  Patient verbally denies any shortness of breath, fever, cough and chest pain during phone call   -------------  SDW INSTRUCTIONS given:  Your procedure is scheduled on 11/21.  Report to [facility] Main Entrance "A" at 0530 A.M., and check in at the Admitting office.  Call this number if you have problems the morning of surgery:  [phone]   Remember:  Do not eat after midnight the night before your surgery  You may drink clear liquids until 0430 AM the morning of your surgery.   Clear liquids allowed are: Water, Non-Citrus Juices (without pulp), Carbonated Beverages, Clear Tea, Black Coffee Only, and Gatorade    Take these medicines the morning of surgery with A SIP OF WATER: None  As of today, STOP taking any Aspirin (unless otherwise instructed by your surgeon) Aleve, Naproxen, Ibuprofen, Motrin, Advil, Goody's, BC's, all herbal medications, fish oil, and all vitamins.                      Do not wear jewelry, make up, or nail polish            Do not wear lotions, powders, perfumes/colognes, or deodorant.            Do not shave 48 hours prior to surgery.  Men may shave face and neck.            Do not bring valuables to the hospital.            [facility] is not responsible for any belongings or valuables.  Do NOT Smoke (Tobacco/Vaping) 24 hours prior to your procedure If you use a CPAP at night, you may bring all equipment for your overnight stay.   Contacts, glasses, dentures or bridgework may not be worn into surgery.      For patients admitted to the hospital, discharge time will be determined by your treatment team.   Patients discharged  the day of surgery will not be allowed to drive home, and someone needs to stay with them for 24 hours.    Special instructions:   [facility]- Preparing For Surgery  Before surgery, you can play an important role. Because skin is not sterile, your skin needs to be as free of germs as possible. You can reduce the number of germs on your skin by washing with CHG (chlorahexidine gluconate) Soap before surgery.  CHG is an antiseptic cleaner which kills germs and bonds with the skin to continue killing germs even after washing.    Oral Hygiene is also important to reduce your risk of infection.  Remember - BRUSH YOUR TEETH THE MORNING OF SURGERY WITH YOUR REGULAR TOOTHPASTE  Please do not use if you have an allergy to CHG or antibacterial soaps. If your skin becomes reddened/irritated stop using the CHG.  Do not shave (including legs and underarms) for at least 48 hours prior to first CHG shower. It is OK to shave your face.  Please follow these instructions carefully.   Shower the NIGHT BEFORE SURGERY and the MORNING OF SURGERY with DIAL Soap.   Pat yourself dry  with a CLEAN TOWEL.  Wear CLEAN PAJAMAS to bed the night before surgery  Place CLEAN SHEETS on your bed the night of your first shower and DO NOT SLEEP WITH PETS.   Day of Surgery: Please shower morning of surgery  Wear Clean/Comfortable clothing the morning of surgery Do not apply any deodorants/lotions.   Remember to brush your teeth WITH YOUR REGULAR TOOTHPASTE.   Questions were answered. Patient verbalized understanding of instructions.

## 2023-03-03 ENCOUNTER — Inpatient Hospital Stay: Payer: PPO

## 2023-03-03 ENCOUNTER — Inpatient Hospital Stay: Payer: PPO | Attending: Hematology and Oncology | Admitting: Hematology and Oncology

## 2023-03-03 VITALS — BP 157/68 | HR 76 | Temp 97.2°F | Resp 18 | Ht 63.0 in | Wt 125.8 lb

## 2023-03-03 DIAGNOSIS — Z87891 Personal history of nicotine dependence: Secondary | ICD-10-CM | POA: Insufficient documentation

## 2023-03-03 DIAGNOSIS — C50512 Malignant neoplasm of lower-outer quadrant of left female breast: Secondary | ICD-10-CM | POA: Insufficient documentation

## 2023-03-03 DIAGNOSIS — Z170 Estrogen receptor positive status [ER+]: Secondary | ICD-10-CM | POA: Diagnosis not present

## 2023-03-03 DIAGNOSIS — Z79811 Long term (current) use of aromatase inhibitors: Secondary | ICD-10-CM | POA: Insufficient documentation

## 2023-03-03 NOTE — Assessment & Plan Note (Addendum)
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 After reviewing the pathology in detail, we proceeded to discuss the different treatment options between surgery to resect the margins, radiation, and antiestrogen therapies.  Treatment plan: Resection of the positive margins Adjuvant radiation Adjuvant antiestrogen therapy with anastrozole 1 mg daily x 5 years  Return to clinic after radiation to start antiestrogens

## 2023-03-03 NOTE — Progress Notes (Signed)
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 The patient discovered the lump herself, located under the breast on the rib. She has no prior history of breast problems and no family history of breast cancer, although both parents died of different types of cancer. The patient also has osteoporosis, which has improved to osteopenia with treatment. The patient expressed concern about the need for further examination of the opposite breast, as the last mammogram was in March and did not show any abnormalities. The patient also inquired about the potential impact of diet on estrogen production, given that her cancer is estrogen and progesterone receptor positive.     I reviewed her records extensively and collaborated the history with the patient.  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, involves dermis, negative for LVI, ER 90%, PR 10%, Ki67 35%, HER2 0 negative      MEDICAL HISTORY:  Past Medical History:  Diagnosis Date   Allergy    Breast cancer, left breast (HCC)    MVP (mitral valve prolapse)    Osteoporosis     SURGICAL HISTORY: Past Surgical History:  Procedure Laterality Date   MASS EXCISION Left 02/15/2023   Procedure: EXCISION CHEST WALL MASS;  Surgeon: [name], MD;  Location: [facility];  Service: General;  Laterality: Left;  GEN LMA    TONSILLECTOMY     87 years old    SOCIAL HISTORY: Social History   Socioeconomic History   Marital status: Married    Spouse name: Not on file   Number of children: Not on file   Years of education: Not on file   Highest education level: Not on file  Occupational History   Not on file  Tobacco Use   Smoking status: Former    Current packs/day: 0.00    Types: Cigarettes    Quit date: 1980    Years since quitting: 44.9   Smokeless tobacco: Never  Vaping Use   Vaping status: Never Used  Substance and Sexual Activity   Alcohol use: Yes    Comment: Social drinker 2-3 times weekly   Drug use: Never   Sexual activity: Not on file  Other Topics Concern   Not on file  Social History Narrative   Not on file   Social Determinants of Health   Financial Resource Strain: Not on file  Food Insecurity: No Food Insecurity (03/01/2023)   Hunger Vital Sign    Worried About Running Out of Food in the Last Year: Never true    Ran Out of Food in the Last Year: Never true  Transportation Needs: No Transportation Needs (03/01/2023)   PRAPARE - Administrator, Civil Service (Medical): No    Lack of Transportation (Non-Medical): No  Physical Activity: Not on file  Stress: Not on file  Social Connections: Not on file  Intimate Partner Violence: Not At Risk (03/01/2023)   Humiliation, Afraid,  Rape, and Kick questionnaire    Fear of Current or Ex-Partner: No    Emotionally Abused: No    Physically Abused: No    Sexually Abused: No    FAMILY HISTORY: Family History  Problem Relation Age of Onset   Cancer Mother    Hypertension Mother    Cancer Father     ALLERGIES:  has No Known Allergies.  MEDICATIONS:  Current Outpatient Medications  Medication Sig Dispense Refill   CALCIUM PO Take 1 tablet by mouth daily.     cetirizine (ZYRTEC) 10 MG tablet Take 10 mg by mouth at bedtime.     denosumab (PROLIA) 60 MG/ML SOSY injection Inject 60 mg into the skin every 6 (six)  months.     Multiple Vitamin (MULTIVITAMIN) tablet Take 1 tablet by mouth daily.     VITAMIN D PO Take 1 capsule by mouth daily.     No current facility-administered medications for this visit.    REVIEW OF SYSTEMS:   Constitutional: Denies fevers, chills or abnormal night sweats  All other systems were reviewed with the patient and are negative.  PHYSICAL EXAMINATION: ECOG PERFORMANCE STATUS: 0 - Asymptomatic  Vitals:   03/03/23 1457  BP: (!) 157/68  Pulse: 76  Resp: 18  Temp: (!) 97.2 F (36.2 C)  SpO2: 98%   Filed Weights   03/03/23 1457  Weight: 125 lb 12.8 oz (57.1 kg)    GENERAL:alert, no distress and comfortable    LABORATORY DATA:  I have reviewed the data as listed Lab Results  Component Value Date   WBC 3.8 (L) 04/21/2018   HGB 12.7 04/21/2018   HCT 39.2 04/21/2018   MCV 93.6 04/21/2018   PLT 174 04/21/2018   Lab Results  Component Value Date   NA 141 04/21/2018   K 4.8 04/21/2018   CL 107 04/21/2018   CO2 28 04/21/2018    RADIOGRAPHIC STUDIES: I have personally reviewed the radiological reports and agreed with the findings in the report.  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 After reviewing the pathology in detail, we proceeded to discuss the different treatment options between surgery to resect the margins, radiation, and antiestrogen therapies.  Treatment plan: Resection of the positive margins Adjuvant radiation Adjuvant  antiestrogen therapy with anastrozole 1 mg daily x 5 years  Return to clinic after radiation to start antiestrogens  Assessment and Plan    Invasive Lobular Carcinoma Self-detected mass under the left breast on the rib. Positive for estrogen and progesterone receptors, negative for HER2 receptor. Intermediate grade (2) with a KI-67 of 35%. Stage 1B. Margins not clear, requiring further surgical intervention. -Undergo further surgical intervention to clear margins. -Consider MRI of both breasts post-surgery for thorough check. -Initiate Anastrozole 1mg  daily for 5 years post-radiation therapy.  Osteoporosis Currently managed with Prolia, showing improvement to osteopenia. -Continue Prolia as prescribed. -Monitor bone density regularly.  General Health Maintenance -Consider lifestyle modifications to reduce estrogen production, including plant-based diet and regular exercise. -Plan for follow-up after  completion of radiation therapy.         All questions were answered. The patient knows to call the clinic with any problems, questions or concerns.    Tamsen Meek, MD 03/03/23

## 2023-03-04 ENCOUNTER — Other Ambulatory Visit: Payer: Self-pay

## 2023-03-04 ENCOUNTER — Ambulatory Visit (HOSPITAL_COMMUNITY): Payer: PPO | Admitting: Certified Registered"

## 2023-03-04 ENCOUNTER — Other Ambulatory Visit: Payer: PPO

## 2023-03-04 ENCOUNTER — Encounter (HOSPITAL_COMMUNITY)
Admission: RE | Disposition: A | Discharge status: Home / Self Care | Payer: Self-pay | Source: Home / Self Care | Attending: General Surgery

## 2023-03-04 ENCOUNTER — Encounter: Payer: Self-pay | Admitting: *Deleted

## 2023-03-04 ENCOUNTER — Ambulatory Visit (HOSPITAL_COMMUNITY)
Admission: RE | Admit: 2023-03-04 | Discharge: 2023-03-04 | Disposition: A | Discharge status: Home / Self Care | Payer: PPO | Attending: General Surgery | Admitting: General Surgery

## 2023-03-04 ENCOUNTER — Encounter (HOSPITAL_COMMUNITY): Payer: Self-pay | Admitting: General Surgery

## 2023-03-04 DIAGNOSIS — C50912 Malignant neoplasm of unspecified site of left female breast: Secondary | ICD-10-CM | POA: Diagnosis not present

## 2023-03-04 DIAGNOSIS — Z87891 Personal history of nicotine dependence: Secondary | ICD-10-CM | POA: Diagnosis not present

## 2023-03-04 DIAGNOSIS — N641 Fat necrosis of breast: Secondary | ICD-10-CM | POA: Diagnosis not present

## 2023-03-04 HISTORY — PX: RE-EXCISION OF BREAST LUMPECTOMY: SHX6048

## 2023-03-04 HISTORY — DX: Malignant neoplasm of unspecified site of left female breast: C50.912

## 2023-03-04 LAB — BASIC METABOLIC PANEL
Anion gap: 7 (ref 5–15)
BUN: 12 mg/dL (ref 8–23)
CO2: 27 mmol/L (ref 22–32)
Calcium: 8.8 mg/dL — ABNORMAL LOW (ref 8.9–10.3)
Chloride: 106 mmol/L (ref 98–111)
Creatinine, Ser: 0.85 mg/dL (ref 0.44–1.00)
GFR, Estimated: >60 mL/min (ref >60)
Glucose, Bld: 101 mg/dL — ABNORMAL HIGH (ref 70–99)
Potassium: 3.3 mmol/L — ABNORMAL LOW (ref 3.5–5.1)
Sodium: 140 mmol/L (ref 135–145)

## 2023-03-04 LAB — CBC
HCT: 35.8 % — ABNORMAL LOW (ref 36.0–46.0)
Hemoglobin: 11.9 g/dL — ABNORMAL LOW (ref 12.0–15.0)
MCH: 30.4 pg (ref 26.0–34.0)
MCHC: 33.2 g/dL (ref 30.0–36.0)
MCV: 91.3 fL (ref 80.0–100.0)
Platelets: 148 10*3/uL — ABNORMAL LOW (ref 150–400)
RBC: 3.92 MIL/uL (ref 3.87–5.11)
RDW: 12.6 % (ref 11.5–15.5)
WBC: 3 10*3/uL — ABNORMAL LOW (ref 4.0–10.5)
nRBC: 0 % (ref 0.0–0.2)

## 2023-03-04 SURGERY — EXCISION, LESION, BREAST
Anesthesia: General | Site: Breast | Laterality: Left

## 2023-03-04 MED ORDER — FENTANYL CITRATE (PF) 250 MCG/5ML IJ SOLN
INTRAMUSCULAR | Status: DC | PRN
  Administered 2023-03-04 (×3): 25 ug via INTRAVENOUS

## 2023-03-04 MED ORDER — 0.9 % SODIUM CHLORIDE (POUR BTL) OPTIME
TOPICAL | Status: DC | PRN
  Administered 2023-03-04: 1000 mL

## 2023-03-04 MED ORDER — ACETAMINOPHEN 160 MG/5ML PO SOLN: 1000.0000 mg | Freq: Once | ORAL | Status: DC | PRN

## 2023-03-04 MED ORDER — BUPIVACAINE-EPINEPHRINE 0.25% -1:200000 IJ SOLN
INTRAMUSCULAR | Status: DC | PRN
  Administered 2023-03-04: 10 mL

## 2023-03-04 MED ORDER — ONDANSETRON HCL 4 MG/2ML IJ SOLN
INTRAMUSCULAR | Status: AC
  Filled 2023-03-04: qty 2

## 2023-03-04 MED ORDER — LIDOCAINE 2% (20 MG/ML) 5 ML SYRINGE
INTRAMUSCULAR | Status: AC
  Filled 2023-03-04: qty 5

## 2023-03-04 MED ORDER — DEXAMETHASONE SODIUM PHOSPHATE 10 MG/ML IJ SOLN
INTRAMUSCULAR | Status: DC | PRN
  Administered 2023-03-04: 5 mg via INTRAVENOUS

## 2023-03-04 MED ORDER — CEFAZOLIN SODIUM-DEXTROSE 2-4 GM/100ML-% IV SOLN
2.0000 g | INTRAVENOUS | Status: AC
  Administered 2023-03-04: 2 g via INTRAVENOUS
  Filled 2023-03-04: qty 100

## 2023-03-04 MED ORDER — PROPOFOL 10 MG/ML IV BOLUS
INTRAVENOUS | Status: DC | PRN
  Administered 2023-03-04: 70 mg via INTRAVENOUS
  Administered 2023-03-04: 30 mg via INTRAVENOUS

## 2023-03-04 MED ORDER — ONDANSETRON HCL 4 MG/2ML IJ SOLN
INTRAMUSCULAR | Status: DC | PRN
  Administered 2023-03-04: 4 mg via INTRAVENOUS

## 2023-03-04 MED ORDER — LACTATED RINGERS IV SOLN
INTRAVENOUS | Status: DC
Start: 2023-03-04 — End: 2023-03-04

## 2023-03-04 MED ORDER — OXYCODONE HCL 5 MG/5ML PO SOLN: 5.0000 mg | Freq: Once | ORAL | Status: DC | PRN

## 2023-03-04 MED ORDER — CHLORHEXIDINE GLUCONATE CLOTH 2 % EX PADS: 6.0000 | MEDICATED_PAD | Freq: Once | CUTANEOUS | Status: DC

## 2023-03-04 MED ORDER — DEXAMETHASONE SODIUM PHOSPHATE 10 MG/ML IJ SOLN
INTRAMUSCULAR | Status: AC
  Filled 2023-03-04: qty 1

## 2023-03-04 MED ORDER — PROPOFOL 10 MG/ML IV BOLUS
INTRAVENOUS | Status: AC
  Filled 2023-03-04: qty 20

## 2023-03-04 MED ORDER — ENSURE PRE-SURGERY PO LIQD: 296.0000 mL | Freq: Once | ORAL | Status: DC

## 2023-03-04 MED ORDER — FENTANYL CITRATE (PF) 100 MCG/2ML IJ SOLN
INTRAMUSCULAR | Status: AC
  Filled 2023-03-04: qty 2

## 2023-03-04 MED ORDER — EPHEDRINE 5 MG/ML INJ
INTRAVENOUS | Status: AC
  Filled 2023-03-04: qty 5

## 2023-03-04 MED ORDER — PROPOFOL 500 MG/50ML IV EMUL
INTRAVENOUS | Status: DC | PRN
  Administered 2023-03-04: 100 ug/kg/min via INTRAVENOUS

## 2023-03-04 MED ORDER — ACETAMINOPHEN 500 MG PO TABS: 1000.0000 mg | ORAL_TABLET | Freq: Once | ORAL | Status: DC | PRN

## 2023-03-04 MED ORDER — BUPIVACAINE-EPINEPHRINE (PF) 0.25% -1:200000 IJ SOLN
INTRAMUSCULAR | Status: AC
  Filled 2023-03-04: qty 30

## 2023-03-04 MED ORDER — ORAL CARE MOUTH RINSE: 15.0000 mL | Freq: Once | OROMUCOSAL | Status: AC

## 2023-03-04 MED ORDER — ACETAMINOPHEN 10 MG/ML IV SOLN: 1000.0000 mg | Freq: Once | INTRAVENOUS | Status: DC | PRN

## 2023-03-04 MED ORDER — PHENYLEPHRINE 80 MCG/ML (10ML) SYRINGE FOR IV PUSH (FOR BLOOD PRESSURE SUPPORT)
PREFILLED_SYRINGE | INTRAVENOUS | Status: AC
  Filled 2023-03-04: qty 10

## 2023-03-04 MED ORDER — ACETAMINOPHEN 500 MG PO TABS
1000.0000 mg | ORAL_TABLET | ORAL | Status: AC
  Administered 2023-03-04: 1000 mg via ORAL
  Filled 2023-03-04: qty 2

## 2023-03-04 MED ORDER — OXYCODONE HCL 5 MG PO TABS: 5.0000 mg | ORAL_TABLET | Freq: Once | ORAL | Status: DC | PRN

## 2023-03-04 MED ORDER — CHLORHEXIDINE GLUCONATE 0.12 % MT SOLN
15.0000 mL | Freq: Once | OROMUCOSAL | Status: AC
Start: 2023-03-04 — End: 2023-03-04
  Administered 2023-03-04: 15 mL via OROMUCOSAL
  Filled 2023-03-04: qty 15

## 2023-03-04 MED ORDER — LIDOCAINE 2% (20 MG/ML) 5 ML SYRINGE
INTRAMUSCULAR | Status: DC | PRN
  Administered 2023-03-04: 40 mg via INTRAVENOUS

## 2023-03-04 MED ORDER — FENTANYL CITRATE (PF) 100 MCG/2ML IJ SOLN: 25.0000 ug | INTRAMUSCULAR | Status: DC | PRN

## 2023-03-04 SURGICAL SUPPLY — 35 items
APPLIER CLIP 9.375 MED OPEN (MISCELLANEOUS)
BAG COUNTER SPONGE SURGICOUNT (BAG) ×1 IMPLANT
BINDER BREAST LRG (GAUZE/BANDAGES/DRESSINGS) IMPLANT
BINDER BREAST XLRG (GAUZE/BANDAGES/DRESSINGS) IMPLANT
CANISTER SUCT 3000ML PPV (MISCELLANEOUS) ×1 IMPLANT
CHLORAPREP W/TINT 26 (MISCELLANEOUS) ×1 IMPLANT
CLIP APPLIE 9.375 MED OPEN (MISCELLANEOUS) IMPLANT
COVER SURGICAL LIGHT HANDLE (MISCELLANEOUS) ×1 IMPLANT
DERMABOND ADVANCED .7 DNX12 (GAUZE/BANDAGES/DRESSINGS) ×1 IMPLANT
DRAPE CHEST BREAST 15X10 FENES (DRAPES) ×1 IMPLANT
ELECT CAUTERY BLADE 6.4 (BLADE) ×1 IMPLANT
ELECT REM PT RETURN 9FT ADLT (ELECTROSURGICAL) ×1
ELECTRODE REM PT RTRN 9FT ADLT (ELECTROSURGICAL) ×1 IMPLANT
GLOVE BIO SURGEON STRL SZ7 (GLOVE) ×1 IMPLANT
GLOVE BIOGEL PI IND STRL 7.5 (GLOVE) ×1 IMPLANT
GOWN STRL REUS W/ TWL LRG LVL3 (GOWN DISPOSABLE) ×2 IMPLANT
KIT BASIN OR (CUSTOM PROCEDURE TRAY) ×1 IMPLANT
KIT TURNOVER KIT B (KITS) ×1 IMPLANT
NDL HYPO 25GX1X1/2 BEV (NEEDLE) ×1 IMPLANT
NEEDLE HYPO 25GX1X1/2 BEV (NEEDLE) ×1
NS IRRIG 1000ML POUR BTL (IV SOLUTION) ×1 IMPLANT
PACK GENERAL/GYN (CUSTOM PROCEDURE TRAY) ×1 IMPLANT
PAD ARMBOARD 7.5X6 YLW CONV (MISCELLANEOUS) ×1 IMPLANT
PENCIL SMOKE EVACUATOR (MISCELLANEOUS) IMPLANT
SPIKE FLUID TRANSFER (MISCELLANEOUS) ×1 IMPLANT
SUT ETHILON 3 0 FSL (SUTURE) IMPLANT
SUT MNCRL AB 4-0 PS2 18 (SUTURE) IMPLANT
SUT MON AB 5-0 PS2 18 (SUTURE) IMPLANT
SUT SILK 2 0 SH (SUTURE) IMPLANT
SUT VIC AB 2-0 SH 27X BRD (SUTURE) IMPLANT
SUT VIC AB 2-0 SH 27XBRD (SUTURE) ×1 IMPLANT
SUT VIC AB 3-0 SH 27X BRD (SUTURE) ×1 IMPLANT
SYR CONTROL 10ML LL (SYRINGE) ×1 IMPLANT
TOWEL GREEN STERILE (TOWEL DISPOSABLE) ×1 IMPLANT
TOWEL GREEN STERILE FF (TOWEL DISPOSABLE) ×1 IMPLANT

## 2023-03-04 NOTE — Anesthesia Procedure Notes (Signed)
Procedure Name: LMA Insertion Date/Time: 03/04/2023 7:30 AM  Performed by: Alwyn Ren, CRNAPre-anesthesia Checklist: Patient identified, Timeout performed, Emergency Drugs available, Suction available and Patient being monitored Patient Re-evaluated:Patient Re-evaluated prior to induction Oxygen Delivery Method: Circle system utilized Preoxygenation: Pre-oxygenation with 100% oxygen Induction Type: IV induction LMA Size: 3.0 Number of attempts: 1

## 2023-03-04 NOTE — Interval H&P Note (Signed)
History and Physical Interval Note:  03/04/2023 6:39 AM  Becky Ramirez  has presented today for surgery, with the diagnosis of BREAST CANCER.  The various methods of treatment have been discussed with the patient and family. After consideration of risks, benefits and other options for treatment, the patient has consented to  Procedure(s): RE-EXCISION LEFT LUMPECTOMY (Left) as a surgical intervention.  The patient's history has been reviewed, patient examined, no change in status, stable for surgery.  I have reviewed the patient's chart and labs.  Questions were answered to the patient's satisfaction.     [name]

## 2023-03-04 NOTE — Op Note (Signed)
  Preoperative diagnosis: Left breast cancer Postoperative diagnosis: Same as above Procedure: Re-excision left lumpectomy Surgeon: Dr. Harden Mo Anesthesia: General Specimens: anterior margin marked short superior, long lateral Posterior margin marked short superior, long lateral, double deep Estimated blood loss: Minimal Complications: None Drains: None Sponge needle count was correct completion Disposition recovery in stable condition   Indications:86 yof otherwise healthy who has a chest wall mass present for several months. This has gotten bigger. No history infection or drainage. Had [place] in June that was negative. CT scan done in Sept shows focal thickening in left anterior chest wall that is 27x11 mm in size.  I am not entirely sure what this is on exam.  I excised this and was an ILC with positive deep margin.  She now has negative mm and axillary [place]. We discussed returning for margin clearance.    Procedure: After informed consent was obtained she was taken to the operating room.  She was given antibiotics.  SCDs were placed.  She was placed under general anesthesia without complication.  She was prepped and draped in a standard sterile surgical fashion.  A surgical timeout was then performed.   I infiltrated Marcaine around this area.  I then made an elliptical incision to encompass the old scar and removed anterior margin just to make sure this was clear. I then removed additional posterior tissue to include the muscle to get this clear as well. These were margked as above.  I then obtained hemostasis.  I then closed this with 2-0 Vicryl, 3-0 Vicryl, and 4-0 Monocryl.  I did place 2 3-0 nylon sutures externally as well. Glue and Steri-Strips were applied.  She tolerated this well was transferred recovery stable.

## 2023-03-04 NOTE — Anesthesia Preprocedure Evaluation (Signed)
Anesthesia Evaluation  Patient identified by MRN, date of birth, ID band Patient awake    Reviewed: Allergy & Precautions, NPO status , Patient's Chart, lab work & pertinent test results  History of Anesthesia Complications Negative for: history of anesthetic complications  Airway Mallampati: I  TM Distance: >3 FB Neck ROM: Full    Dental  (+) Teeth Intact, Dental Advisory Given,    Pulmonary neg shortness of breath, neg sleep apnea, neg COPD, neg recent URI, former smoker   breath sounds clear to auscultation       Cardiovascular negative cardio Aubert  Rhythm:Regular     Neuro/Psych negative neurological Tague  negative psych Sgro   GI/Hepatic negative GI Trella, Neg liver Seif,,,  Endo/Other  negative endocrine Pascucci    Renal/GU negative Renal Licciardi     Musculoskeletal negative musculoskeletal Griebel (+)    Abdominal   Peds  Hematology negative hematology [place] (+)   Anesthesia Other Findings Breast CA  Reproductive/Obstetrics                             Anesthesia Physical Anesthesia Plan  ASA: 2  Anesthesia Plan: General   Post-op Pain Management: Tylenol PO (pre-op)* and Toradol IV (intra-op)*   Induction: Intravenous  PONV Risk Score and Plan: 4 or greater and Ondansetron, Dexamethasone, Propofol infusion and TIVA  Airway Management Planned: LMA  Additional Equipment: None  Intra-op Plan:   Post-operative Plan: Extubation in OR  Informed Consent: I have reviewed the patients History and Physical, chart, labs and discussed the procedure including the risks, benefits and alternatives for the proposed anesthesia with the patient or authorized representative who has indicated his/her understanding and acceptance.     Dental advisory given  Plan Discussed with: CRNA  Anesthesia Plan Comments:        Anesthesia Quick Evaluation

## 2023-03-04 NOTE — Discharge Instructions (Signed)
[facility] Office Phone Number [phone]  POST OP INSTRUCTIONS Take 400 mg of ibuprofen every 8 hours or 650 mg tylenol every 6 hours for next 72 hours then as needed. Use ice several times daily also.  A prescription for pain medication may be given to you upon discharge.  Take your pain medication as prescribed, if needed.  If narcotic pain medicine is not needed, then you may take acetaminophen (Tylenol), naprosyn (Alleve) or ibuprofen (Advil) as needed. Take your usually prescribed medications unless otherwise directed If you need a refill on your pain medication, please contact your pharmacy.  They will contact our office to request authorization.  Prescriptions will not be filled after 5pm or on week-ends. You should eat very light the first 24 hours after surgery, such as soup, crackers, pudding, etc.  Resume your normal diet the day after surgery. Most patients will experience some swelling and bruising in the breast.  Ice packs and a good support bra will help.  Wear the breast binder provided or a sports bra for 72 hours day and night.  After that wear a sports bra during the day until you return to the office. Swelling and bruising can take several days to resolve.  It is common to experience some constipation if taking pain medication after surgery.  Increasing fluid intake and taking a stool softener will usually help or prevent this problem from occurring.  A mild laxative (Milk of Magnesia or Miralax) should be taken according to package directions if there are no bowel movements after 48 hours. I used skin glue on the incision, you may shower in 24 hours.  The glue will flake off over the next 2-3 weeks.  Any sutures or staples will be removed at the office during your follow-up visit. ACTIVITIES:  You may resume regular daily activities (gradually increasing) beginning the next day.  Wearing a good support bra or sports bra minimizes pain and swelling.  You may have  sexual intercourse when it is comfortable. You may drive when you no longer are taking prescription pain medication, you can comfortably wear a seatbelt, and you can safely maneuver your car and apply brakes. RETURN TO WORK:  ______________________________________________________________________________________ You should see your doctor in the office for a follow-up appointment approximately two weeks after your surgery.  Your doctor's nurse will typically make your follow-up appointment when she calls you with your pathology report.  Expect your pathology report 3-4 business days after your surgery.  You may call to check if you do not hear from us after three days. OTHER INSTRUCTIONS: _______________________________________________________________________________________________ _____________________________________________________________________________________________________________________________________ _____________________________________________________________________________________________________________________________________ _____________________________________________________________________________________________________________________________________  WHEN TO CALL DR Jomari Bartnik: Fever over 101.0 Nausea and/or vomiting. Extreme swelling or bruising. Continued bleeding from incision. Increased pain, redness, or drainage from the incision.  The clinic staff is available to answer your questions during regular business hours.  Please don't hesitate to call and ask to speak to one of the nurses for clinical concerns.  If you have a medical emergency, go to the nearest emergency room or call 911.  A surgeon from [facility] is always on call at the hospital.  For further questions, please visit [email or web address]  

## 2023-03-04 NOTE — Transfer of Care (Signed)
Immediate Anesthesia Transfer of Care Note  Patient: Becky Ramirez  Procedure(s) Performed: RE-EXCISION LEFT BREAST LUMPECTOMY (Left: Breast)  Patient Location: PACU  Anesthesia Type:General  Level of Consciousness: awake  Airway & Oxygen Therapy: Patient Spontanous Breathing and Patient connected to face mask oxygen  Post-op Assessment: Report given to RN and Post -op Vital signs reviewed and stable  Post vital signs: Reviewed and stable  Last Vitals:  Vitals Value Taken Time  BP    Temp    Pulse 68 03/04/23 0818  Resp    SpO2 99 % 03/04/23 0818  Vitals shown include unfiled device data.  Last Pain:  Vitals:   03/04/23 0631  TempSrc:   PainSc: 0-No pain         Complications: No notable events documented.

## 2023-03-05 ENCOUNTER — Encounter (HOSPITAL_COMMUNITY): Payer: Self-pay | Admitting: General Surgery

## 2023-03-05 LAB — SURGICAL PATHOLOGY

## 2023-03-07 NOTE — Anesthesia Postprocedure Evaluation (Signed)
Anesthesia Post Note  Patient: Ineshia Walbridge Okelly  Procedure(s) Performed: RE-EXCISION LEFT BREAST LUMPECTOMY (Left: Breast)     Patient location during evaluation: PACU Anesthesia Type: General Level of consciousness: awake and alert Pain management: pain level controlled Vital Signs Assessment: post-procedure vital signs reviewed and stable Respiratory status: spontaneous breathing, nonlabored ventilation and respiratory function stable Cardiovascular status: blood pressure returned to baseline and stable Postop Assessment: no apparent nausea or vomiting Anesthetic complications: no   No notable events documented.  Last Vitals:  Vitals:   03/04/23 0845 03/04/23 0900  BP: (!) 161/73 (!) 162/76  Pulse: 66 62  Resp: 20 13  Temp:  36.5 C  SpO2: 96% 92%    Last Pain:  Vitals:   03/04/23 0900  TempSrc:   PainSc: 0-No pain                 Razi Hickle

## 2023-03-08 ENCOUNTER — Encounter: Payer: Self-pay | Admitting: *Deleted

## 2023-03-15 ENCOUNTER — Inpatient Hospital Stay: Payer: PPO | Attending: Licensed Clinical Social Worker | Admitting: Licensed Clinical Social Worker

## 2023-03-15 ENCOUNTER — Telehealth: Payer: Self-pay | Admitting: Licensed Clinical Social Worker

## 2023-03-15 DIAGNOSIS — C50512 Malignant neoplasm of lower-outer quadrant of left female breast: Secondary | ICD-10-CM | POA: Insufficient documentation

## 2023-03-15 DIAGNOSIS — M810 Age-related osteoporosis without current pathological fracture: Secondary | ICD-10-CM | POA: Insufficient documentation

## 2023-03-15 DIAGNOSIS — Z170 Estrogen receptor positive status [ER+]: Secondary | ICD-10-CM | POA: Insufficient documentation

## 2023-03-15 NOTE — Telephone Encounter (Signed)
[facility] Clinical Social Work  Clinical Social Work was referred by new patient protocol for assessment of psychosocial needs.  Clinical Social Worker contacted patient by phone to offer support and assess for needs.   Pt unable to talk at the moment but stated she will call back at a more convenient time.    Malekai Markwood E Cheronda Erck, LCSW  Clinical Social Worker [facility]

## 2023-03-15 NOTE — Progress Notes (Signed)
[facility] Clinical Social Work  Clinical Social Work was referred by new patient protocol for assessment of psychosocial needs.  Clinical Social Worker  spoke with pt by phone  to offer support and assess for needs.   CSW introduced self and support services. Pt reports to be doing fairly well with coping and has received good news of clear margins from her surgeon.  CSW confirmed with pt that there are no SDOH needs (pt completed screening on 03/01/23).  CSW encouraged pt to call back if any support or resource needs arise.    Paige Monarrez E Allycia Pitz, LCSW  Clinical Social Worker [facility]

## 2023-03-16 ENCOUNTER — Encounter: Payer: Self-pay | Admitting: Orthopedic Surgery

## 2023-03-16 ENCOUNTER — Ambulatory Visit: Payer: PPO | Admitting: Orthopedic Surgery

## 2023-03-16 DIAGNOSIS — M7741 Metatarsalgia, right foot: Secondary | ICD-10-CM | POA: Diagnosis not present

## 2023-03-16 NOTE — Progress Notes (Signed)
Office Visit Note   Patient: Becky Ramirez           Date of Birth: 02/03/1936           MRN: [id number] Visit Date: 03/16/2023              Requested by: Amador Cunas, PA-C [address] [place],  [place] [address] PCP: Tally Joe, MD  Chief Complaint  Patient presents with   Right Foot - Pain      HPI: Patient is an 87 year old woman who is seen for initial evaluation for metatarsalgia beneath the second and third metatarsal heads right foot.  Patient states that she felt like she stepped wrong on her porch while pushing off and had acute pain beneath the metatarsal heads.  Assessment & Plan: Visit Diagnoses:  1. Metatarsalgia, right foot     Plan: A felt pad was placed in her orthotic to unload the pressure on the metatarsal heads.  Patient states that this did feel better.  Recommend that she obtain a pair of sole orthotics cork with a met pad.  She will follow-up if symptomatic.  Follow-Up Instructions: No follow-ups on file.   Ortho Exam  Patient is alert, oriented, no adenopathy, well-dressed, normal affect, normal respiratory effort. Examination patient has a good dorsalis pedis pulse.  She has good dorsiflexion of the ankle.  She has callus beneath the third metatarsal head and this is area of maximal tenderness to palpation.  With weightbearing she has splaying of the second and third toes with clawing of the second and third toes.  The webspaces are not tender to palpation.  No evidence of a neuroma.  She has tenderness beneath the third MTP joint possibly a plantar plate injury.  Radiograph shows a long second third and fourth metatarsal.  There is valgus alignment to the third MTP joint also possibly consistent with a plantar plate injury.  Imaging: No results found. No images are attached to the encounter.  Labs: No results found for: "HGBA1C", "ESRSEDRATE", "CRP", "LABURIC", "REPTSTATUS", "GRAMSTAIN", "CULT", "LABORGA"   Lab Results   Component Value Date   ALBUMIN 3.6 04/21/2018    No results found for: "MG" No results found for: "VD25OH"  No results found for: "PREALBUMIN"    Latest Ref Rng & Units 03/04/2023    5:51 AM 04/21/2018   10:51 AM  CBC EXTENDED  WBC 4.0 - 10.5 K/uL 3.0  3.8   RBC 3.87 - 5.11 MIL/uL 3.92  4.19   Hemoglobin 12.0 - 15.0 g/dL 23.5  57.3   HCT 22.0 - 46.0 % 35.8  39.2   Platelets 150 - 400 K/uL 148  174   NEUT# 1.7 - 7.7 K/uL  2.1   Lymph# 0.7 - 4.0 K/uL  1.3      There is no height or weight on file to calculate BMI.  Orders:  No orders of the defined types were placed in this encounter.  No orders of the defined types were placed in this encounter.    Procedures: No procedures performed  Clinical Data: No additional findings.  Rabelo:  All other systems negative, except as noted in the HPI. Review of Systems  Objective: Vital Signs: There were no vitals taken for this visit.  Specialty Comments:  No specialty comments available.  PMFS History: Patient Active Problem List   Diagnosis Date Noted   Malignant neoplasm of lower-outer quadrant of left breast of female, estrogen receptor positive (HCC) 03/01/2023  Past Medical History:  Diagnosis Date   Allergy    Breast cancer, left breast (HCC)    MVP (mitral valve prolapse)    Osteoporosis     Family History  Problem Relation Age of Onset   Cancer Mother    Hypertension Mother    Cancer Father     Past Surgical History:  Procedure Laterality Date   MASS EXCISION Left 02/15/2023   Procedure: EXCISION CHEST WALL MASS;  Surgeon: [name], MD;  Location: [facility];  Service: General;  Laterality: Left;  GEN LMA   RE-EXCISION OF BREAST LUMPECTOMY Left 03/04/2023   Procedure: RE-EXCISION LEFT BREAST LUMPECTOMY;  Surgeon: [name], MD;  Location: [facility] OR;  Service: General;  Laterality: Left;   TONSILLECTOMY     87 years old   Social History   Occupational History   Not on  file  Tobacco Use   Smoking status: Former    Current packs/day: 0.00    Types: Cigarettes    Quit date: 1980    Years since quitting: 44.9   Smokeless tobacco: Never  Vaping Use   Vaping status: Never Used  Substance and Sexual Activity   Alcohol use: Yes    Comment: Social drinker 2-3 times weekly   Drug use: Never   Sexual activity: Not on file

## 2023-03-19 ENCOUNTER — Telehealth: Payer: Self-pay | Admitting: *Deleted

## 2023-03-19 NOTE — Telephone Encounter (Signed)
Called pt to discuss appts with Dr Pamelia Hoit and Dr. Roselind Messier. Left detailed msg informing pt she does not need to see Dr. Roselind Messier next week and will receive a call to see Dr. Pamelia Hoit only. Contact information provided for questions or needs.

## 2023-03-24 ENCOUNTER — Telehealth: Payer: Self-pay | Admitting: Hematology and Oncology

## 2023-03-24 NOTE — Telephone Encounter (Signed)
Spoke with patient confirming upcoming appointment  

## 2023-03-25 ENCOUNTER — Ambulatory Visit: Payer: PPO

## 2023-03-25 ENCOUNTER — Ambulatory Visit: Payer: PPO | Admitting: Radiation Oncology

## 2023-03-25 ENCOUNTER — Inpatient Hospital Stay: Payer: PPO | Admitting: Hematology and Oncology

## 2023-03-25 VITALS — BP 135/60 | HR 88 | Temp 97.6°F | Resp 18 | Ht 63.0 in | Wt 125.1 lb

## 2023-03-25 DIAGNOSIS — C50512 Malignant neoplasm of lower-outer quadrant of left female breast: Secondary | ICD-10-CM

## 2023-03-25 DIAGNOSIS — Z170 Estrogen receptor positive status [ER+]: Secondary | ICD-10-CM

## 2023-03-25 DIAGNOSIS — M810 Age-related osteoporosis without current pathological fracture: Secondary | ICD-10-CM | POA: Diagnosis not present

## 2023-03-25 MED ORDER — ANASTROZOLE 1 MG PO TABS: 1.0000 mg | ORAL_TABLET | Freq: Every day | ORAL | 3 refills | Status: DC

## 2023-03-25 NOTE — Progress Notes (Signed)
Patient Care Team: Tally Joe, MD as PCP - General (Family Medicine) Pershing Proud, RN as Oncology Nurse Navigator Donnelly Angelica, RN as Oncology Nurse Navigator Serena Croissant, MD as Consulting Physician (Hematology and Oncology)  DIAGNOSIS:  Encounter Diagnosis  Name Primary?   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 She was diagnosed a few months ago and has been discussing treatment options with her doctor. She has decided against radiation therapy but is open to hormone therapy. She has a history of osteoporosis and is currently on Prolia, which is administered twice a year. She also has a regular exercise routine, which includes weight-bearing exercises at the local [employer].         ALLERGIES:  has no known allergies.  MEDICATIONS:  Current Outpatient Medications  Medication Sig Dispense Refill   anastrozole (ARIMIDEX) 1 MG tablet Take 1 tablet (1 mg total) by mouth daily. 90 tablet 3   CALCIUM PO Take 1 tablet by mouth daily.     cetirizine (ZYRTEC) 10 MG tablet Take 10 mg by mouth at bedtime.     denosumab (PROLIA) 60 MG/ML SOSY  injection Inject 60 mg into the skin every 6 (six) months.     Multiple Vitamin (MULTIVITAMIN) tablet Take 1 tablet by mouth daily.     VITAMIN D PO Take 1 capsule by mouth daily.     No current facility-administered medications for this visit.    PHYSICAL EXAMINATION: ECOG PERFORMANCE STATUS: 1 - Symptomatic but completely ambulatory  Vitals:   03/25/23 1007  BP: 135/60  Pulse: 88  Resp: 18  Temp: 97.6 F (36.4 C)  SpO2: 99%   Filed Weights   03/25/23 1007  Weight: 125 lb 1.6 oz (56.7 kg)     LABORATORY DATA:  I have reviewed the data as listed    Latest Ref Rng & Units 03/04/2023    5:51 AM 04/21/2018   10:51 AM  CMP  Glucose 70 - 99 mg/dL 409  811   BUN 8 - 23 mg/dL 12  12   Creatinine 9.14 - 1.00 mg/dL 7.82  9.56   Sodium 213 - 145 mmol/L 140  141   Potassium 3.5 - 5.1 mmol/L 3.3  4.8   Chloride 98 - 111 mmol/L 106  107   CO2 22 - 32 mmol/L 27  28   Calcium 8.9 - 10.3 mg/dL 8.8  9.1   Total Protein 6.5 - 8.1 g/dL  6.2   Total Bilirubin 0.3 - 1.2 mg/dL  1.1   Alkaline Phos 38 - 126 U/L  52   AST 15 - 41 U/L  16   ALT  0 - 44 U/L  15     Lab Results  Component Value Date   WBC 3.0 (L) 03/04/2023   HGB 11.9 (L) 03/04/2023   HCT 35.8 (L) 03/04/2023   MCV 91.3 03/04/2023   PLT 148 (L) 03/04/2023   NEUTROABS 2.1 04/21/2018    ASSESSMENT & PLAN:  Malignant neoplasm of lower-outer quadrant of left breast of female, estrogen receptor positive (HCC) 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 These include but not  limited to insomnia, hot flashes, mood changes, vaginal dryness, bone density loss, and weight gain. We strongly believe that the benefits far outweigh the risks. Patient understands these risks and consented to starting treatment. Planned treatment duration is 5 years.   Osteoporosis Patient is currently on Prolia for osteoporosis. Discussed the importance of monitoring bone density, especially with the initiation of Anastrozole. -Continue Prolia as prescribed. -Monitor bone density every 2 years.  General Health Maintenance / Followup Plans -Encourage weight-bearing exercises for bone health. -Schedule follow-up in 3 months to discuss survivorship and monitor tolerance to Anastrozole.      Return to clinic in 3 months for survivorship care plan visit     No orders of the defined types were placed in this encounter.  The patient has a good understanding of the overall plan. she agrees with it. she will call with any problems that may develop before the next visit here. Total time spent: 30 mins including face to face time and time spent for planning, charting and co-ordination of care   Tamsen Meek, MD 03/25/23

## 2023-03-25 NOTE — Assessment & Plan Note (Signed)
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 These include but not limited to insomnia, hot flashes, mood changes, vaginal dryness, bone density loss, and weight gain. We strongly believe that the benefits far outweigh the risks. Patient understands these risks and consented to starting treatment. Planned treatment duration is 5 years.  Return to clinic in 3 months for survivorship care plan visit

## 2023-03-26 ENCOUNTER — Encounter: Payer: Self-pay | Admitting: *Deleted

## 2023-03-26 DIAGNOSIS — Z170 Estrogen receptor positive status [ER+]: Secondary | ICD-10-CM

## 2023-03-31 ENCOUNTER — Inpatient Hospital Stay: Payer: PPO | Admitting: Dietician

## 2023-03-31 NOTE — Progress Notes (Signed)
Patient signed up for the Nutrition and Cancer webinar. She was the only participant.  Instead of webinar format patient agreed to a remote one on one consult.  Patient preferred mobile telephone#.  Patient is an active 87 year old female with a history of breast cancer. She decided against radiation therapy.  She is being treated with anastrozole and also has a history of osteoporosis  She also has a regular exercise routine, which includes weight-bearing exercises at the local [employer].  She recently hurt her foot which resulted in some weight gain during her recuperation but she is back to her routines.  Usual PO intake: Oatmeal yogurt fruit (mostly berries) nuts or smoothies (yogurt, greens: loves spinach Peanut butter sandwich Soup, goes out with friends (doesn't cook much since spouse died) Alcohol 1-2 glass 1-2 times a month, uses soy milk   Weight relatively stable and healthy.  We reviewed AICR guidelines.  Reviewed sources of potassium to boost K+.  Answered questions regarding anti inflammatory diets and health difference among Olive, avocado, and canola oils.  Contact information provided for future concerns.  Gennaro Africa, RDN, LDN Registered Dietitian, [facility] Part Time Remote (Usual office hours: Tuesday-Thursday) Mobile: [phone] Remote Office: [phone]

## 2023-04-16 ENCOUNTER — Telehealth: Payer: Self-pay | Admitting: *Deleted

## 2023-04-16 NOTE — Telephone Encounter (Signed)
 Received call from pt with complaint of dizziness x2 weeks with episodes of vomiting. Pt states dizziness occurs when getting up walk or changing positions in bed.  Per covering provider, pt needing to hold Anastrozole  and be seen in [facility]. Pt states her family is out of town and does not have a ride to our office for a [facility] visit.  Pt educated to hold Anastrozole  and perform Epley Maneuver this weekend to see if symptoms resolve.  Pt also educated to seek care at nearest ED if symptoms become worse.  Pt verbalized understanding and states she will contact our office on Monday with an update.

## 2023-04-23 ENCOUNTER — Telehealth: Payer: Self-pay | Admitting: *Deleted

## 2023-04-23 NOTE — Telephone Encounter (Signed)
 Received call from pt stating she was seen by PCP and vertigo is related to viral infection pt is experiencing with post nasal drip and nasal congestion.  Per MD pt to restart anastrozole  and alert our office if vertigo continues.  Pt verbalized understanding.

## 2023-04-28 ENCOUNTER — Inpatient Hospital Stay: Payer: PPO | Admitting: Hematology and Oncology

## 2023-05-04 IMAGING — MG MM DIGITAL SCREENING BILAT W/ TOMO AND CAD
8 series · 9 of 24 positions shown · non-contrast
Comparison: Previous exam(s).

CLINICAL DATA: Screening.

EXAM:
DIGITAL SCREENING BILATERAL MAMMOGRAM WITH TOMOSYNTHESIS AND CAD
TECHNIQUE: Bilateral screening digital craniocaudal and mediolateral oblique
mammograms were obtained. Bilateral screening digital breast
tomosynthesis was performed. The images were evaluated with
computer-aided detection.

[L MLO synth-2D]
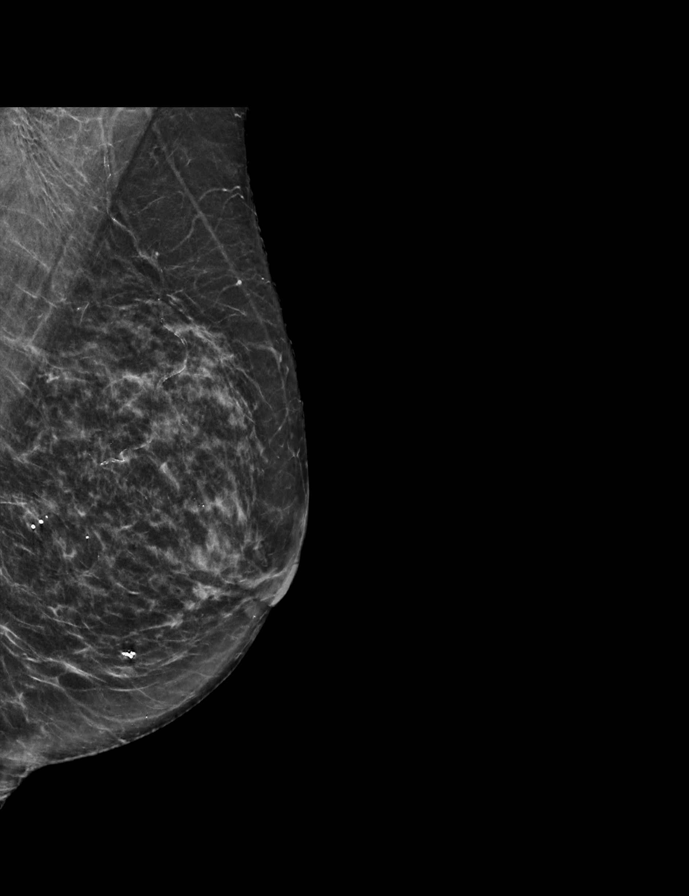

[R CC synth-2D]
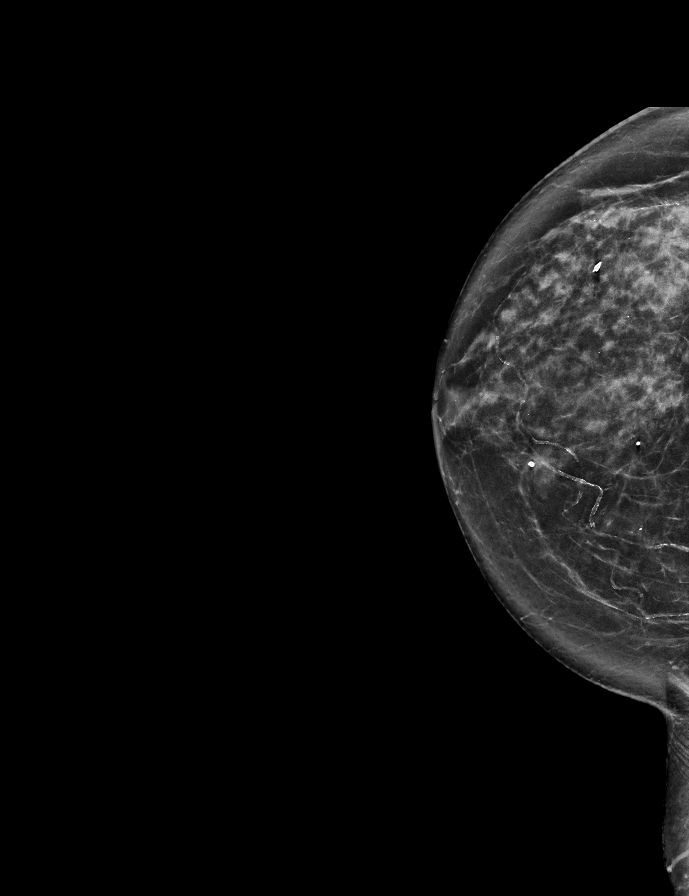

[L CC synth-2D]
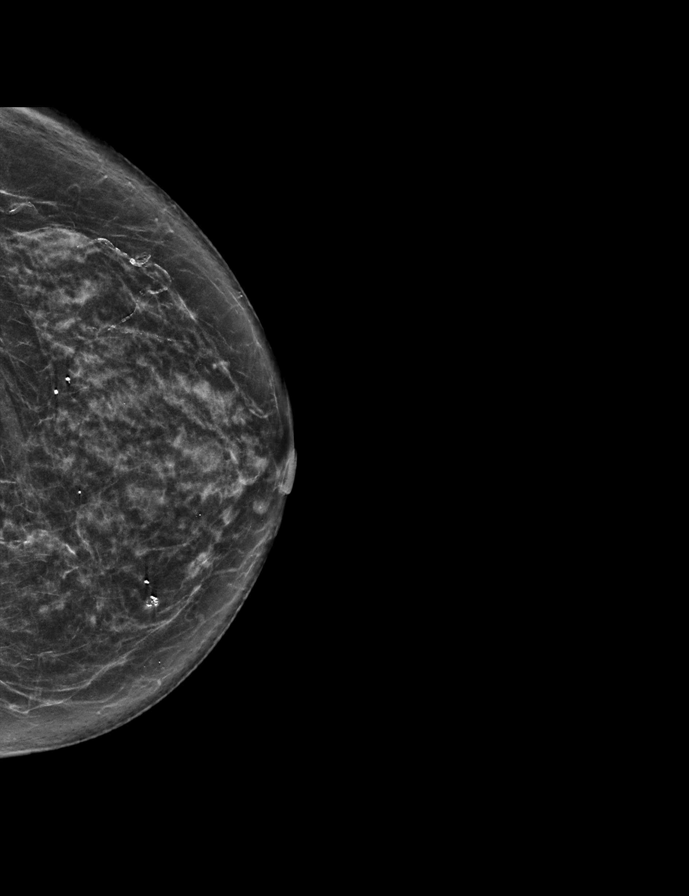

[R MLO synth-2D]
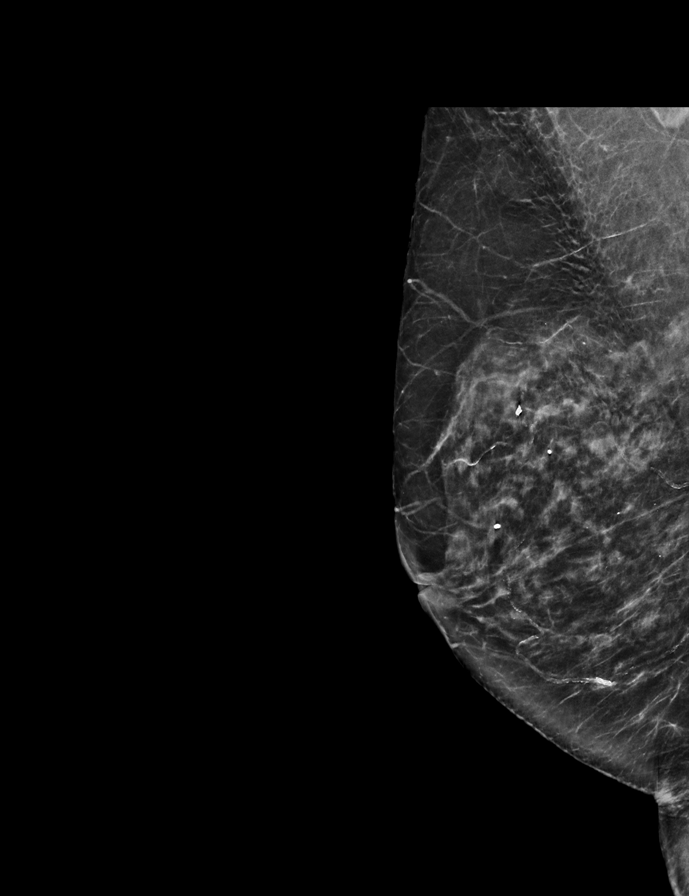

[R CC tomo · 2 of 64 frames shown]
[frame 21/64]
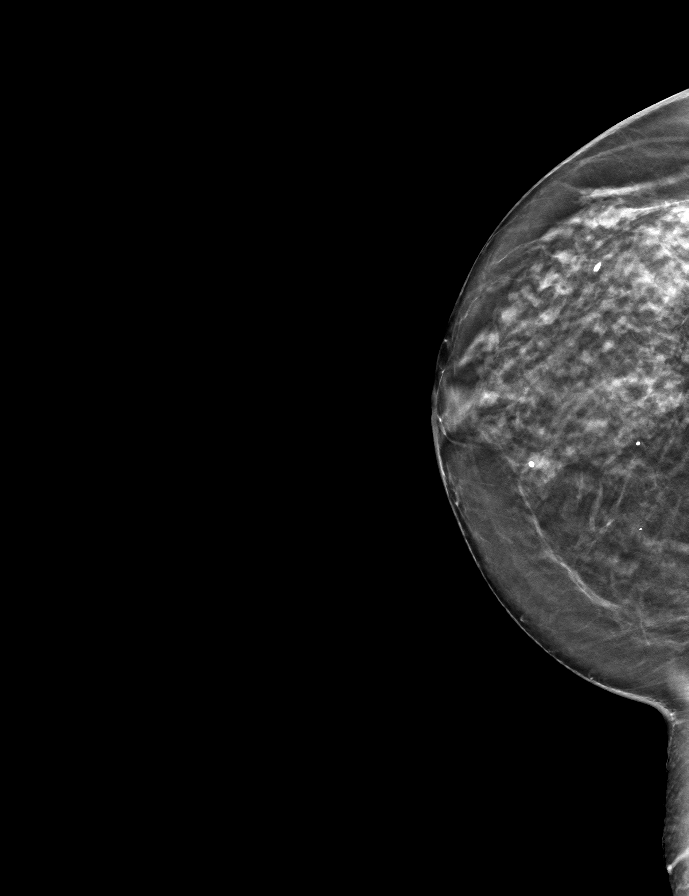
[frame 33/64]
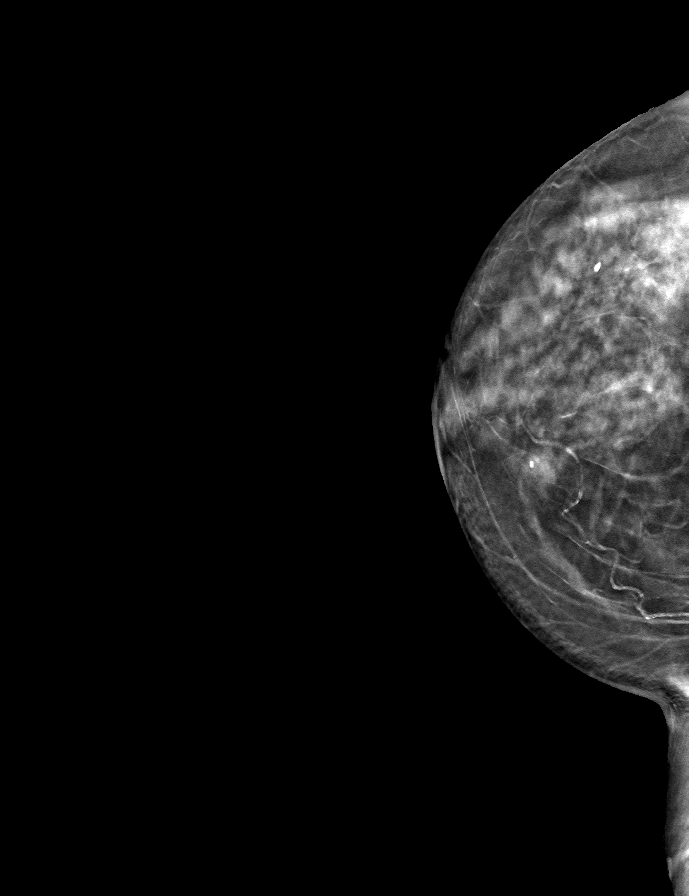

[L CC tomo · tomo slice 29/58.0]
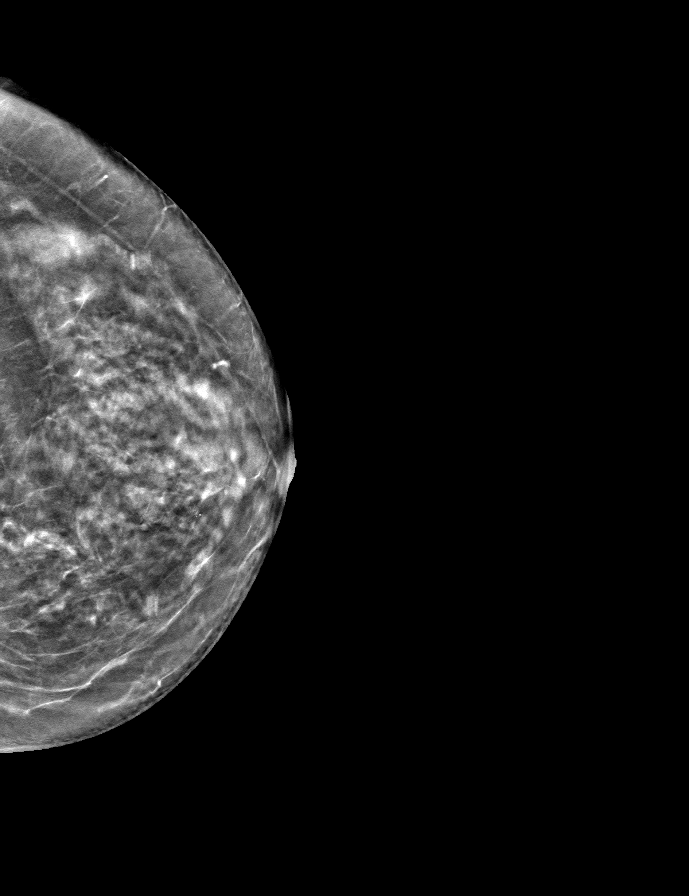

[L MLO tomo · tomo slice 29/58.0]
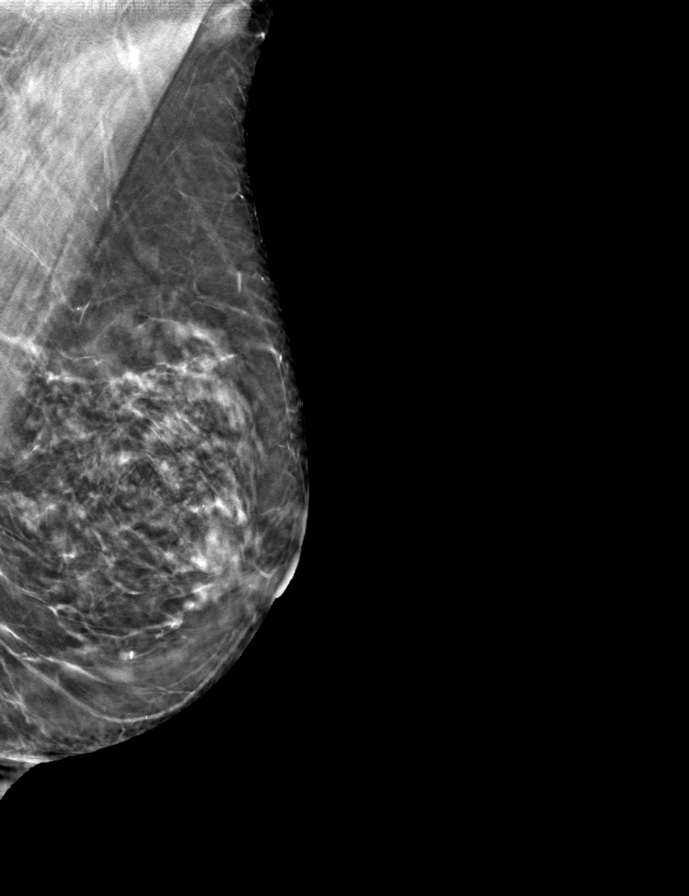

[R MLO tomo · tomo slice 31/60.0]
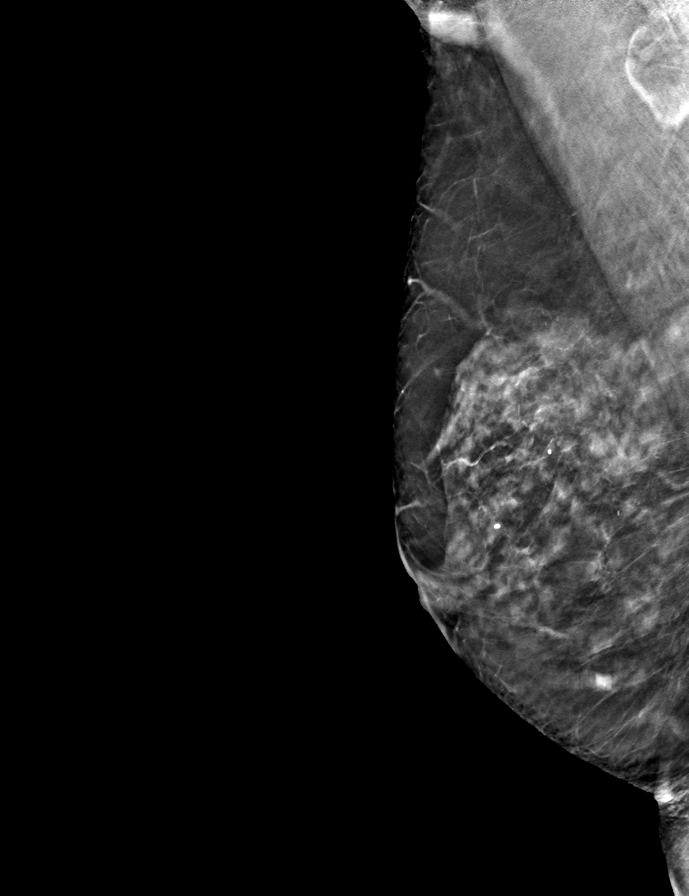

[9 of 24 positions shown; findings below may reference images not displayed]

ACR Breast Density Category c: The breast tissue is heterogeneously
dense, which may obscure small masses.
FINDINGS: There are no findings suspicious for malignancy.
IMPRESSION: No mammographic evidence of malignancy. A result letter of this
screening mammogram will be mailed directly to the patient.

RECOMMENDATION:
Screening mammogram in one year. (Code:[id number]-W-BC3)

BI-RADS CATEGORY  1: Negative.

## 2023-06-01 ENCOUNTER — Other Ambulatory Visit: Payer: Self-pay | Admitting: Family Medicine

## 2023-06-01 DIAGNOSIS — Z1231 Encounter for screening mammogram for malignant neoplasm of breast: Secondary | ICD-10-CM

## 2023-06-23 ENCOUNTER — Ambulatory Visit
Admission: RE | Admit: 2023-06-23 | Discharge: 2023-06-23 | Disposition: A | Discharge status: Home / Self Care | Payer: PPO | Source: Ambulatory Visit | Attending: Family Medicine | Admitting: Family Medicine

## 2023-06-23 ENCOUNTER — Other Ambulatory Visit: Payer: Self-pay | Admitting: General Surgery

## 2023-06-23 DIAGNOSIS — Z1231 Encounter for screening mammogram for malignant neoplasm of breast: Secondary | ICD-10-CM

## 2023-06-23 DIAGNOSIS — Z853 Personal history of malignant neoplasm of breast: Secondary | ICD-10-CM

## 2023-06-28 ENCOUNTER — Inpatient Hospital Stay: Payer: PPO | Attending: Licensed Clinical Social Worker | Admitting: Adult Health

## 2023-06-28 ENCOUNTER — Telehealth: Payer: Self-pay | Admitting: Adult Health

## 2023-06-28 ENCOUNTER — Encounter: Payer: Self-pay | Admitting: Adult Health

## 2023-06-28 ENCOUNTER — Other Ambulatory Visit: Payer: Self-pay

## 2023-06-28 VITALS — BP 124/57 | HR 77 | Temp 97.7°F | Resp 18 | Ht 63.0 in | Wt 120.0 lb

## 2023-06-28 DIAGNOSIS — C50512 Malignant neoplasm of lower-outer quadrant of left female breast: Secondary | ICD-10-CM | POA: Diagnosis not present

## 2023-06-28 DIAGNOSIS — Z79811 Long term (current) use of aromatase inhibitors: Secondary | ICD-10-CM | POA: Insufficient documentation

## 2023-06-28 DIAGNOSIS — Z1721 Progesterone receptor positive status: Secondary | ICD-10-CM | POA: Insufficient documentation

## 2023-06-28 DIAGNOSIS — Z87891 Personal history of nicotine dependence: Secondary | ICD-10-CM | POA: Diagnosis not present

## 2023-06-28 DIAGNOSIS — N63 Unspecified lump in unspecified breast: Secondary | ICD-10-CM

## 2023-06-28 DIAGNOSIS — Z170 Estrogen receptor positive status [ER+]: Secondary | ICD-10-CM | POA: Insufficient documentation

## 2023-06-28 DIAGNOSIS — N6310 Unspecified lump in the right breast, unspecified quadrant: Secondary | ICD-10-CM | POA: Diagnosis not present

## 2023-06-28 DIAGNOSIS — Z1732 Human epidermal growth factor receptor 2 negative status: Secondary | ICD-10-CM | POA: Insufficient documentation

## 2023-06-28 NOTE — Telephone Encounter (Signed)
 Scheduled appointments per 3/17 los. Talked with the patient and she is aware of the made appointments.

## 2023-06-28 NOTE — Progress Notes (Signed)
 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, involves dermis, negative for LVI, ER 90%, PR 10%, Ki67 35%, HER2 0 negative   02/15/2023 Cancer Staging   Staging form: Breast, AJCC 8th Edition - Pathologic stage from 02/15/2023: Stage IA (pT2, pN0, cM0, G2, ER+, PR+, HER2-) - Signed by Loa Socks, NP on 06/28/2023 Stage prefix: Initial diagnosis Histologic grading system: 3 grade system   03/03/2023 Cancer Staging   Staging form: Breast, AJCC 8th Edition - Clinical: Stage IB (cT2, cN0, cM0, G2, ER+, PR+, HER2-) - Signed by Serena Croissant, MD on 03/03/2023 Histologic grading system: 3 grade system   03/06/2023 Surgery   Reexcision cleared margins   03/2023 -  Anti-estrogen oral therapy   1 mg Anastrozole x 5 years     INTERVAL HISTORY:  Ms. Hardiman to review her survivorship care plan detailing her treatment course for breast cancer, as well as monitoring long-term side effects of that treatment, education regarding health maintenance, screening, and overall wellness and health promotion.     Overall, Ms. Dirosa reports feeling quite well   REVIEW OF SYSTEMS:  Review of Systems  Constitutional:  Negative for appetite change, chills, fatigue, fever and unexpected weight change.  HENT:   Negative for hearing loss, lump/mass and trouble swallowing.   Eyes:  Negative for eye problems and icterus.  Respiratory:  Negative for chest tightness, cough and shortness of breath.   Cardiovascular:  Negative for chest pain, leg swelling and palpitations.  Gastrointestinal:  Negative for abdominal distention, abdominal pain, constipation, diarrhea, nausea and vomiting.  Endocrine: Negative for hot flashes.  Genitourinary:  Negative for  difficulty urinating.   Musculoskeletal:  Negative for arthralgias.  Skin:  Negative for itching and rash.  Neurological:  Negative for dizziness, extremity weakness, headaches and numbness.  Hematological:  Negative for adenopathy. Does not bruise/bleed easily.  Psychiatric/Behavioral:  Negative for depression. The patient is not nervous/anxious.    Breast: Denies any new nodularity, masses, tenderness, nipple changes, or nipple discharge.       PAST MEDICAL/SURGICAL HISTORY:  Past Medical History:  Diagnosis Date   Allergy    Breast cancer, left breast (HCC)    MVP (mitral valve prolapse)    Osteoporosis    Past Surgical History:  Procedure Laterality Date   MASS EXCISION Left 02/15/2023   Procedure: EXCISION CHEST WALL MASS;  Surgeon: [name], MD;  Location: [facility];  Service: General;  Laterality: Left;  GEN LMA   RE-EXCISION OF BREAST LUMPECTOMY Left 03/04/2023   Procedure: RE-EXCISION LEFT BREAST LUMPECTOMY;  Surgeon: [name], MD;  Location: [facility] OR;  Service: General;  Laterality: Left;   TONSILLECTOMY     88 years old     ALLERGIES:  No Known Allergies   CURRENT MEDICATIONS:  Outpatient Encounter Medications as of 06/28/2023  Medication Sig   anastrozole (ARIMIDEX) 1 MG tablet Take 1 tablet (1 mg total) by mouth daily.   CALCIUM PO Take 1 tablet by mouth daily.   cetirizine (ZYRTEC) 10 MG tablet Take 10 mg by mouth at bedtime.   denosumab (PROLIA) 60 MG/ML SOSY injection Inject 60 mg into the skin every 6 (six) months.   Multiple Vitamin (MULTIVITAMIN) tablet Take 1 tablet by mouth daily.  VITAMIN D PO Take 1 capsule by mouth daily.   No facility-administered encounter medications on file as of 06/28/2023.     ONCOLOGIC FAMILY HISTORY:  Family History  Problem Relation Age of Onset   Cancer Mother    Hypertension Mother    Cancer Father      SOCIAL HISTORY:  Social History   Socioeconomic History   Marital  status: Married    Spouse name: Not on file   Number of children: Not on file   Years of education: Not on file   Highest education level: Not on file  Occupational History   Not on file  Tobacco Use   Smoking status: Former    Current packs/day: 0.00    Types: Cigarettes    Quit date: 1980    Years since quitting: 45.2   Smokeless tobacco: Never  Vaping Use   Vaping status: Never Used  Substance and Sexual Activity   Alcohol use: Yes    Comment: Social drinker 2-3 times weekly   Drug use: Never   Sexual activity: Not on file  Other Topics Concern   Not on file  Social History Narrative   Not on file   Social Drivers of Health   Financial Resource Strain: Not on file  Food Insecurity: No Food Insecurity (03/01/2023)   Hunger Vital Sign    Worried About Running Out of Food in the Last Year: Never true    Ran Out of Food in the Last Year: Never true  Transportation Needs: No Transportation Needs (03/01/2023)   PRAPARE - Administrator, Civil Service (Medical): No    Lack of Transportation (Non-Medical): No  Physical Activity: Not on file  Stress: Not on file  Social Connections: Not on file  Intimate Partner Violence: Not At Risk (03/01/2023)   Humiliation, Afraid, Rape, and Kick questionnaire    Fear of Current or Ex-Partner: No    Emotionally Abused: No    Physically Abused: No    Sexually Abused: No     OBSERVATIONS/OBJECTIVE:  BP (!) 124/57 (BP Location: Left Arm, Patient Position: Sitting)   Pulse 77   Temp 97.7 F (36.5 C) (Tympanic)   Resp 18   Ht 5\' 3"  (1.6 m)   Wt 120 lb (54.4 kg)   SpO2 99%   BMI 21.26 kg/m  GENERAL: Patient is a well appearing female in no acute distress HEENT:  Sclerae anicteric.  Oropharynx clear and moist. No ulcerations or evidence of oropharyngeal candidiasis. Neck is supple.  NODES:  No cervical, supraclavicular, or axillary lymphadenopathy palpated.  BREAST EXAM: Left breast status postlumpectomy, no sign of  local recurrence, right breast 1cm nodule noted at 9 oclock 2cmfn. LUNGS:  Clear to auscultation bilaterally.  No wheezes or rhonchi. HEART:  Regular rate and rhythm. No murmur appreciated. ABDOMEN:  Soft, nontender.  Positive, normoactive bowel sounds. No organomegaly palpated. MSK:  No focal spinal tenderness to palpation. Full range of motion bilaterally in the upper extremities. EXTREMITIES:  No peripheral edema.   SKIN:  Clear with no obvious rashes or skin changes. No nail dyscrasia. NEURO:  Nonfocal. Well oriented.  Appropriate affect.   LABORATORY DATA:  None for this visit.  DIAGNOSTIC IMAGING:  None for this visit.      ASSESSMENT AND PLAN:  Ms.. Keleher is a pleasant 88 y.o. female with Stage IA left breast invasive lobular carcinoma, ER+/PR+/HER2-, diagnosed in 01/2023, treated with lumpectomy and anti-estrogen therapy with Anastrozole beginning in 03/2023.  She presents to the Survivorship Clinic for our initial meeting and routine follow-up post-completion of treatment for breast cancer.    1. Stage IA left breast cancer:  Ms. Ferraiolo is continuing to recover from definitive treatment for breast cancer. She will follow-up with her medical oncologist, Dr.  Pamelia Hoit in 6 months with history and physical exam per surveillance protocol.  She will continue her anti-estrogen therapy with Anastrozole. Thus far, she is tolerating the Anastrozole well, with minimal side effects. Today, a comprehensive survivorship care plan and treatment summary was reviewed with the patient today detailing her breast cancer diagnosis, treatment course, potential late/long-term effects of treatment, appropriate follow-up care with recommendations for the future, and patient education resources.  A copy of this summary, along with a letter will be sent to the patient's primary care provider via mail/fax/In Basket message after today's visit.    2. Right breast nodule: I ordered bilateral diagnsotic mammogram and  ultrasound to further evaluate.  My nurse is calling the [facility]res[facility] center to expedite her appointment to sooner than 08/2023.  3. Bone health:  Given Ms. Bazinet age/history of breast cancer and her current treatment regimen including anti-estrogen therapy with Anastrozole, she is at risk for bone demineralization.  Undergoes bone density testing regularly with her primary care provider.  We are reaching out to their office to obtain a copy of these results.  She is receiving Prolia every 6 months and we recommend she undergo bone density testing every 2 years..  She was given education on specific activities to promote bone health.  4. Cancer screening:  Due to Ms. Glab's history and her age, she should receive screening for skin cancers.  The information and recommendations are listed on the patient's comprehensive care plan/treatment summary and were reviewed in detail with the patient.    5. Health maintenance and wellness promotion: Ms. Mcgahee was encouraged to consume 5-7 servings of fruits and vegetables per day. We reviewed the "Nutrition Rainbow" handout.  She was also encouraged to engage in moderate to vigorous exercise for 30 minutes per day most days of the week.  She was instructed to limit her alcohol consumption and continue to abstain from tobacco use.     6. Support services/counseling: It is not uncommon for this period of the patient's cancer care trajectory to be one of many emotions and stressors.   She was given information regarding our available services and encouraged to contact me with any questions or for help enrolling in any of our support group/programs.    Follow up instructions:    -Return to cancer center in 6 months for f/u with Dr. Pamelia Hoit  -Mammogram due in 10/2023 -She is welcome to return back to the Survivorship Clinic at any time; no additional follow-up needed at this time.  -Consider referral back to survivorship as a long-term survivor for continued surveillance  The  patient was provided an opportunity to ask questions and all were answered. The patient agreed with the plan and demonstrated an understanding of the instructions.   Total encounter time:40 minutes*in face-to-face visit time, chart review, lab review, care coordination, order entry, and documentation of the encounter time.    Lillard Anes, NP 06/28/23 1:19 PM Medical Oncology and Hematology [facility] [address] [place], [place] [address] Tel. [phone]    Fax. [phone]  *Total Encounter Time as defined by the Centers for Medicare and Medicaid Services includes, in addition to the face-to-face time of a patient visit (documented  in the note above) non-face-to-face time: obtaining and reviewing outside history, ordering and reviewing medications, tests or procedures, care coordination (communications with other health care professionals or caregivers) and documentation in the medical record.

## 2023-06-29 ENCOUNTER — Encounter: Payer: Self-pay | Admitting: Gastroenterology

## 2023-07-07 ENCOUNTER — Ambulatory Visit
Admission: RE | Admit: 2023-07-07 | Discharge: 2023-07-07 | Disposition: A | Discharge status: Home / Self Care | Source: Ambulatory Visit | Attending: Adult Health | Admitting: Adult Health

## 2023-07-07 ENCOUNTER — Other Ambulatory Visit: Payer: Self-pay | Admitting: Adult Health

## 2023-07-07 DIAGNOSIS — Z853 Personal history of malignant neoplasm of breast: Secondary | ICD-10-CM | POA: Diagnosis not present

## 2023-07-07 DIAGNOSIS — Z170 Estrogen receptor positive status [ER+]: Secondary | ICD-10-CM

## 2023-07-07 DIAGNOSIS — N63 Unspecified lump in unspecified breast: Secondary | ICD-10-CM

## 2023-07-07 DIAGNOSIS — N6310 Unspecified lump in the right breast, unspecified quadrant: Secondary | ICD-10-CM

## 2023-07-07 DIAGNOSIS — N6489 Other specified disorders of breast: Secondary | ICD-10-CM | POA: Diagnosis not present

## 2023-07-07 DIAGNOSIS — N6315 Unspecified lump in the right breast, overlapping quadrants: Secondary | ICD-10-CM | POA: Diagnosis not present

## 2023-07-19 ENCOUNTER — Ambulatory Visit
Admission: RE | Admit: 2023-07-19 | Discharge: 2023-07-19 | Disposition: A | Discharge status: Home / Self Care | Source: Ambulatory Visit | Attending: Adult Health | Admitting: Adult Health

## 2023-07-19 DIAGNOSIS — N63 Unspecified lump in unspecified breast: Secondary | ICD-10-CM

## 2023-07-19 DIAGNOSIS — N6315 Unspecified lump in the right breast, overlapping quadrants: Secondary | ICD-10-CM | POA: Diagnosis not present

## 2023-07-19 DIAGNOSIS — R928 Other abnormal and inconclusive findings on diagnostic imaging of breast: Secondary | ICD-10-CM | POA: Diagnosis not present

## 2023-07-19 DIAGNOSIS — N6310 Unspecified lump in the right breast, unspecified quadrant: Secondary | ICD-10-CM

## 2023-07-19 DIAGNOSIS — N6011 Diffuse cystic mastopathy of right breast: Secondary | ICD-10-CM | POA: Diagnosis not present

## 2023-07-19 DIAGNOSIS — R92331 Mammographic heterogeneous density, right breast: Secondary | ICD-10-CM | POA: Diagnosis not present

## 2023-07-19 DIAGNOSIS — Z170 Estrogen receptor positive status [ER+]: Secondary | ICD-10-CM

## 2023-07-19 HISTORY — PX: BREAST BIOPSY: SHX20

## 2023-07-20 LAB — SURGICAL PATHOLOGY

## 2023-08-10 ENCOUNTER — Encounter (INDEPENDENT_AMBULATORY_CARE_PROVIDER_SITE_OTHER): Payer: Self-pay

## 2023-08-10 ENCOUNTER — Ambulatory Visit (INDEPENDENT_AMBULATORY_CARE_PROVIDER_SITE_OTHER): Payer: PPO | Admitting: Otolaryngology

## 2023-08-10 VITALS — BP 124/73 | HR 90 | Ht 63.0 in | Wt 121.0 lb

## 2023-08-10 DIAGNOSIS — H6123 Impacted cerumen, bilateral: Secondary | ICD-10-CM | POA: Diagnosis not present

## 2023-08-10 NOTE — Progress Notes (Signed)
 Dear Dr. Janifer Meigs, Here is my assessment for our mutual patient, Becky Ramirez. Thank you for allowing me the opportunity to care for your patient. Please do not hesitate to contact me should you have any other questions. Sincerely, Dr. Milon Aloe  Otolaryngology Clinic Note Referring provider: Dr. Janifer Meigs HPI:  Becky Ramirez is a 88 y.o. female kindly referred by Dr. Janifer Meigs for evaluation of cerumen impaction. Initially seen for same complaint Oct 2024. Returns for follow up. Otherwise doing well, feels like impaction may be back.   She denies any vertigo, drainage, sharp pain, cracking/popping, barotrauma, prior ear surgery, vestibular suppressant use.  PMHx: MVP, Osteoporosis, AR  H&N Surgery: Tonsillectomy Personal or FHx of bleeding dz or anesthesia difficulty: no   Independent Review of Additional Tests or Records:  None today  PMH/Meds/All/SocHx/FamHx/Spurgeon:   Past Medical History:  Diagnosis Date   Allergy    Breast cancer, left breast (HCC)    MVP (mitral valve prolapse)    Osteoporosis      Past Surgical History:  Procedure Laterality Date   BREAST BIOPSY Right 07/19/2023   US  RT BREAST BX W LOC DEV 1ST LESION IMG BX SPEC US  GUIDE 07/19/2023 [facility] MAMMOGRAPHY   MASS EXCISION Left 02/15/2023   Procedure: EXCISION CHEST WALL MASS;  Surgeon: Enid Harry, MD;  Location: [facility];  Service: General;  Laterality: Left;  GEN LMA   RE-EXCISION OF BREAST LUMPECTOMY Left 03/04/2023   Procedure: RE-EXCISION LEFT BREAST LUMPECTOMY;  Surgeon: Enid Harry, MD;  Location: [facility] OR;  Service: General;  Laterality: Left;   TONSILLECTOMY     88 years old    Family History  Problem Relation Age of Onset   Cancer Mother    Hypertension Mother    Cancer Father      Social Connections: Not on file     Current Outpatient Medications:    anastrozole  (ARIMIDEX ) 1 MG tablet, Take 1 tablet (1 mg total) by mouth daily., Disp: 90 tablet, Rfl: 3   CALCIUM PO, Take 1  tablet by mouth daily., Disp: , Rfl:    cetirizine (ZYRTEC) 10 MG tablet, Take 10 mg by mouth at bedtime., Disp: , Rfl:    denosumab  (PROLIA ) 60 MG/ML SOSY injection, Inject 60 mg into the skin every 6 (six) months., Disp: , Rfl:    Multiple Vitamin (MULTIVITAMIN) tablet, Take 1 tablet by mouth daily., Disp: , Rfl:    VITAMIN D PO, Take 1 capsule by mouth daily., Disp: , Rfl:    Physical Exam:   BP 124/73 (BP Location: Right Arm, Patient Position: Sitting, Cuff Size: Normal)   Pulse 90   Ht 5\' 3"  (1.6 m)   Wt 121 lb (54.9 kg)   SpO2 93%   BMI 21.43 kg/m    Salient findings:  CN II-XII intact Bilateral cerumen impaction; after clearance, bilateral EAC clear and TM intact with well pneumatized middle ear spaces; narrow cartilaginous canals Weber 512: Midline Rinne 512: AC > BC b/l  No respiratory distress or stridor  Procedures:  Procedure: Bilateral ear microscopy and cerumen removal using microscope (CPT 69210) - Mod 50 Pre-procedure diagnosis: Cerumen impaction bilateral external ears Post-procedure diagnosis: same Indication: Bilateral cerumen impaction; given patient's otologic complaints and history as well as for improved and comprehensive examination of external ear and tympanic membrane, bilateral otologic examination using microscope was performed and impacted cerumen removed  Procedure: Patient was placed semi-recumbent. Verbal consent was obtained prior to procedure. Both ear canals were examined using the  microscope with findings above. Impacted cerumen removed on left and on right using suction and currette with improvement in EAC examination and patency. Patient tolerated the procedure well.      Impression & Plans:  Becky Ramirez is a 87 y.o. female with no contributory PMHx with: Bilateral cerumen impaction - cleared today; use baby oil once weekly -Discussed audiogram but she deferred - f/u as needed per her preference   Thank you for allowing me the  opportunity to care for your patient. Please do not hesitate to contact me should you have any other questions.  Sincerely, Milon Aloe, MD Otolaryngologist (ENT), [facility] Phone: [phone] Fax: [phone]  08/10/2023, 2:00 PM    MDM:  [phone]

## 2023-08-23 ENCOUNTER — Telehealth: Payer: Self-pay | Admitting: Adult Health

## 2023-08-23 NOTE — Telephone Encounter (Signed)
 Reviewed recommendation with patient for consideration of bilateral breast MRI as recommended by Dr. Arceo now that she has had a few weeks to recover from breast biopsy.  She denies any current palpable abnormality in either breast.  She would like to hold off on breast MRI for the time being and will call me if she wishes to pursue this option.    Alwin Baars, NP 08/23/23 3:50 PM Medical Oncology and Hematology [facility] [address] [place], [place] [address] Tel. [phone]    Fax. [phone]

## 2023-08-25 ENCOUNTER — Other Ambulatory Visit

## 2023-08-25 ENCOUNTER — Encounter

## 2023-11-01 ENCOUNTER — Telehealth: Payer: Self-pay

## 2023-11-01 NOTE — Telephone Encounter (Signed)
 Pt called and is concerned about a grape sized lump on her left breast she found yesterday. She reports it is movable and directly beneath her lumpectomy site. She states she will be on vacation until 8/3 but is asking for this to be evaluated upon her return. She was offered [facility] visit with Morna Kendall, NP for 11/17/23 at 1020 and accepted. She knows to call if she experiences redness swelling or fever.

## 2023-11-17 ENCOUNTER — Inpatient Hospital Stay: Attending: Adult Health | Admitting: Adult Health

## 2023-11-17 VITALS — BP 118/70 | HR 77 | Temp 96.5°F | Resp 14 | Ht 63.0 in | Wt 120.4 lb

## 2023-11-17 DIAGNOSIS — Z1721 Progesterone receptor positive status: Secondary | ICD-10-CM | POA: Insufficient documentation

## 2023-11-17 DIAGNOSIS — Z170 Estrogen receptor positive status [ER+]: Secondary | ICD-10-CM | POA: Insufficient documentation

## 2023-11-17 DIAGNOSIS — Z79811 Long term (current) use of aromatase inhibitors: Secondary | ICD-10-CM | POA: Insufficient documentation

## 2023-11-17 DIAGNOSIS — Z87891 Personal history of nicotine dependence: Secondary | ICD-10-CM | POA: Diagnosis not present

## 2023-11-17 DIAGNOSIS — Z1732 Human epidermal growth factor receptor 2 negative status: Secondary | ICD-10-CM | POA: Diagnosis not present

## 2023-11-17 DIAGNOSIS — C50512 Malignant neoplasm of lower-outer quadrant of left female breast: Secondary | ICD-10-CM | POA: Insufficient documentation

## 2023-11-17 NOTE — Progress Notes (Signed)
 [facility] Cancer Follow up:    Becky Lenis, MD [address] [place] [address]   DIAGNOSIS: Cancer Staging  Malignant neoplasm of lower-outer quadrant of left breast of female, estrogen receptor positive (HCC) Staging form: Breast, AJCC 8th Edition - Pathologic stage from 02/15/2023: Stage IA (pT2, pN0, cM0, G2, ER+, PR+, HER2-) - Signed by Crawford Morna Pickle, NP on 06/28/2023 Stage prefix: Initial diagnosis Histologic grading system: 3 grade system - Clinical: Stage IB (cT2, cN0, cM0, G2, ER+, PR+, HER2-) - Signed by Odean Potts, MD on 03/03/2023 Histologic grading system: 3 grade system    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, involves dermis, negative for LVI, ER 90%, PR 10%, Ki67 35%, HER2 0 negative   02/15/2023 Cancer Staging   Staging form: Breast, AJCC 8th Edition - Pathologic stage from 02/15/2023: Stage IA (pT2, pN0, cM0, G2, ER+, PR+, HER2-) - Signed by Crawford Morna Pickle, NP on 06/28/2023 Stage prefix: Initial diagnosis Histologic grading system: 3 grade system   03/03/2023 Cancer Staging   Staging form: Breast, AJCC 8th Edition - Clinical: Stage IB (cT2, cN0, cM0, G2, ER+, PR+, HER2-) - Signed by Odean Potts, MD on 03/03/2023 Histologic grading system: 3 grade system   03/06/2023 Surgery   Reexcision cleared margins   03/2023 -  Anti-estrogen oral therapy   1 mg Anastrozole  x 5 years     CURRENT THERAPY:  INTERVAL HISTORY:  Discussed the use of AI scribe software for clinical note transcription with the patient, who gave verbal consent to proceed.  Becky Ramirez 88 y.o. female returns for    Patient Active Problem List   Diagnosis Date Noted  . Malignant neoplasm  of lower-outer quadrant of left breast of female, estrogen receptor positive (HCC) 03/01/2023    has no known allergies.  MEDICAL HISTORY: Past Medical History:  Diagnosis Date  . Allergy   . Breast cancer, left breast (HCC)   . MVP (mitral valve prolapse)   . Osteoporosis     SURGICAL HISTORY: Past Surgical History:  Procedure Laterality Date  . BREAST BIOPSY Right 07/19/2023   US  RT BREAST BX W LOC DEV 1ST LESION IMG BX SPEC US  GUIDE 07/19/2023 [facility] MAMMOGRAPHY  . MASS EXCISION Left 02/15/2023   Procedure: EXCISION CHEST WALL MASS;  Surgeon: Ebbie Cough, MD;  Location: [facility];  Service: General;  Laterality: Left;  GEN LMA  . RE-EXCISION OF BREAST LUMPECTOMY Left 03/04/2023   Procedure: RE-EXCISION LEFT BREAST LUMPECTOMY;  Surgeon: Ebbie Cough, MD;  Location: [facility] OR;  Service: General;  Laterality: Left;  . TONSILLECTOMY     88 years old    SOCIAL HISTORY: Social History   Socioeconomic History  . Marital status: Married    Spouse name: Not on file  . Number of children: Not on file  . Years of education: Not on file  . Highest education level: Not on file  Occupational History  . Not on file  Tobacco Use  . Smoking status: Former    Current packs/day: 0.00    Types: Cigarettes    Quit date: 1980    Years since quitting: 45.6  . Smokeless tobacco: Never  Vaping Use  . Vaping status: Never Used  Substance  and Sexual Activity  . Alcohol use: Yes    Comment: Social drinker 2-3 times weekly  . Drug use: Never  . Sexual activity: Not on file  Other Topics Concern  . Not on file  Social History Narrative  . Not on file   Social Drivers of Health   Financial Resource Strain: Not on file  Food Insecurity: No Food Insecurity (03/01/2023)   Hunger Vital Sign   . Worried About Programme researcher, broadcasting/film/video in the Last Year: Never true   . Ran Out of Food in the Last Year: Never true  Transportation Needs: No Transportation Needs (03/01/2023)    PRAPARE - Transportation   . Lack of Transportation (Medical): No   . Lack of Transportation (Non-Medical): No  Physical Activity: Not on file  Stress: Not on file  Social Connections: Not on file  Intimate Partner Violence: Not At Risk (03/01/2023)   Humiliation, Afraid, Rape, and Kick questionnaire   . Fear of Current or Ex-Partner: No   . Emotionally Abused: No   . Physically Abused: No   . Sexually Abused: No    FAMILY HISTORY: Family History  Problem Relation Age of Onset  . Cancer Mother   . Hypertension Mother   . Cancer Father     Review of Systems - Oncology    PHYSICAL EXAMINATION    There were no vitals filed for this visit.  Physical Exam  LABORATORY DATA:  CBC    Component Value Date/Time   WBC 3.0 (L) 03/04/2023 0551   RBC 3.92 03/04/2023 0551   HGB 11.9 (L) 03/04/2023 0551   HCT 35.8 (L) 03/04/2023 0551   PLT 148 (L) 03/04/2023 0551   MCV 91.3 03/04/2023 0551   MCH 30.4 03/04/2023 0551   MCHC 33.2 03/04/2023 0551   RDW 12.6 03/04/2023 0551   LYMPHSABS 1.3 04/21/2018 1051   MONOABS 0.4 04/21/2018 1051   EOSABS 0.0 04/21/2018 1051   BASOSABS 0.0 04/21/2018 1051    CMP     Component Value Date/Time   NA 140 03/04/2023 0551   K 3.3 (L) 03/04/2023 0551   CL 106 03/04/2023 0551   CO2 27 03/04/2023 0551   GLUCOSE 101 (H) 03/04/2023 0551   BUN 12 03/04/2023 0551   CREATININE 0.85 03/04/2023 0551   CALCIUM 8.8 (L) 03/04/2023 0551   PROT 6.2 (L) 04/21/2018 1051   ALBUMIN 3.6 04/21/2018 1051   AST 16 04/21/2018 1051   ALT 15 04/21/2018 1051   ALKPHOS 52 04/21/2018 1051   BILITOT 1.1 04/21/2018 1051   GFRNONAA >60 03/04/2023 0551   GFRAA >60 04/21/2018 1051     ASSESSMENT and THERAPY PLAN:   No problem-specific Assessment & Plan notes found for this encounter.     All questions were answered. The patient knows to call the clinic with any problems, questions or concerns. We can certainly see the patient much sooner if  necessary.  Total encounter time:*** minutes*in face-to-face visit time, chart review, lab review, care coordination, order entry, and documentation of the encounter time.    Morna Kendall, NP 11/17/23 10:19 AM Medical Oncology and Hematology [facility] [address] [place], [place] [address] Tel. [phone]    Fax. [phone]  *Total Encounter Time as defined by the Centers for Medicare and Medicaid Services includes, in addition to the face-to-face time of a patient visit (documented in the note above) non-face-to-face time: obtaining and reviewing outside history, ordering and reviewing medications, tests or procedures, care coordination (  communications with other health care professionals or caregivers) and documentation in the medical record.

## 2023-11-19 ENCOUNTER — Other Ambulatory Visit: Payer: Self-pay

## 2023-11-19 ENCOUNTER — Other Ambulatory Visit: Payer: Self-pay | Admitting: *Deleted

## 2023-11-19 ENCOUNTER — Ambulatory Visit
Admission: RE | Admit: 2023-11-19 | Discharge: 2023-11-19 | Disposition: A | Discharge status: Home / Self Care | Source: Ambulatory Visit | Attending: Adult Health | Admitting: Adult Health

## 2023-11-19 DIAGNOSIS — Z170 Estrogen receptor positive status [ER+]: Secondary | ICD-10-CM

## 2023-11-19 DIAGNOSIS — R222 Localized swelling, mass and lump, trunk: Secondary | ICD-10-CM | POA: Diagnosis not present

## 2023-11-19 DIAGNOSIS — Z853 Personal history of malignant neoplasm of breast: Secondary | ICD-10-CM

## 2023-11-25 ENCOUNTER — Other Ambulatory Visit: Payer: Self-pay | Admitting: Adult Health

## 2023-11-29 ENCOUNTER — Other Ambulatory Visit: Payer: Self-pay | Admitting: Hematology and Oncology

## 2023-11-29 ENCOUNTER — Telehealth: Payer: Self-pay

## 2023-11-29 DIAGNOSIS — N6320 Unspecified lump in the left breast, unspecified quadrant: Secondary | ICD-10-CM

## 2023-11-29 NOTE — Telephone Encounter (Signed)
 Called Mrs. Bargar back to confirm with her that [facility] at mammogram for 12/01/2023. Patient concerns were alleviated because she was concerned they had not called her yet and she called this morning to get it scheduled. Andrea CHRISTELLA Plunk, RN

## 2023-12-01 ENCOUNTER — Other Ambulatory Visit: Payer: Self-pay | Admitting: Hematology and Oncology

## 2023-12-01 ENCOUNTER — Inpatient Hospital Stay
Admission: RE | Admit: 2023-12-01 | Discharge: 2023-12-01 | Discharge status: Home / Self Care | Source: Ambulatory Visit | Attending: Hematology and Oncology

## 2023-12-01 ENCOUNTER — Ambulatory Visit
Admission: RE | Admit: 2023-12-01 | Discharge: 2023-12-01 | Disposition: A | Discharge status: Home / Self Care | Source: Ambulatory Visit | Attending: Hematology and Oncology | Admitting: Hematology and Oncology

## 2023-12-01 DIAGNOSIS — N6320 Unspecified lump in the left breast, unspecified quadrant: Secondary | ICD-10-CM

## 2023-12-01 DIAGNOSIS — N6324 Unspecified lump in the left breast, lower inner quadrant: Secondary | ICD-10-CM | POA: Diagnosis not present

## 2023-12-01 DIAGNOSIS — R928 Other abnormal and inconclusive findings on diagnostic imaging of breast: Secondary | ICD-10-CM | POA: Diagnosis not present

## 2023-12-03 ENCOUNTER — Ambulatory Visit
Admission: RE | Admit: 2023-12-03 | Discharge: 2023-12-03 | Disposition: A | Discharge status: Home / Self Care | Source: Ambulatory Visit | Attending: Hematology and Oncology | Admitting: Hematology and Oncology

## 2023-12-03 DIAGNOSIS — N6320 Unspecified lump in the left breast, unspecified quadrant: Secondary | ICD-10-CM

## 2023-12-03 DIAGNOSIS — C50312 Malignant neoplasm of lower-inner quadrant of left female breast: Secondary | ICD-10-CM | POA: Diagnosis not present

## 2023-12-03 DIAGNOSIS — N6324 Unspecified lump in the left breast, lower inner quadrant: Secondary | ICD-10-CM | POA: Diagnosis not present

## 2023-12-03 HISTORY — PX: BREAST BIOPSY: SHX20

## 2023-12-06 DIAGNOSIS — M810 Age-related osteoporosis without current pathological fracture: Secondary | ICD-10-CM | POA: Diagnosis not present

## 2023-12-06 LAB — SURGICAL PATHOLOGY

## 2023-12-08 ENCOUNTER — Encounter: Payer: Self-pay | Admitting: *Deleted

## 2023-12-09 ENCOUNTER — Other Ambulatory Visit: Payer: Self-pay | Admitting: General Surgery

## 2023-12-09 DIAGNOSIS — C50912 Malignant neoplasm of unspecified site of left female breast: Secondary | ICD-10-CM | POA: Diagnosis not present

## 2023-12-14 ENCOUNTER — Inpatient Hospital Stay: Admitting: Hematology and Oncology

## 2023-12-14 ENCOUNTER — Other Ambulatory Visit: Payer: Self-pay | Admitting: *Deleted

## 2023-12-14 ENCOUNTER — Telehealth: Payer: Self-pay | Admitting: *Deleted

## 2023-12-14 DIAGNOSIS — C50512 Malignant neoplasm of lower-outer quadrant of left female breast: Secondary | ICD-10-CM

## 2023-12-14 NOTE — Telephone Encounter (Signed)
 Spoke with patient to r/s her appt with Dr. Odean per Dr. Ebbie from 9/2 to 10/6 at 1115am. Patient confirmed.

## 2023-12-14 NOTE — Assessment & Plan Note (Deleted)
 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, involves dermis, negative for LVI, ER 90%, PR 10%, Ki67 35%, HER2 0 negative  03/04/2023: Left anterior and posterior margin excision: Benign T2 N0 stage Ib Patient decided against radiation   Treatment plan: Adjuvant antiestrogen therapy with anastrozole  1 mg daily x 5 years started 03/25/2023 Anastrozole  toxicities:   Breast cancer surveillance: Mammogram and ultrasound 12/01/2023: Suspicious 7 mm mass left breast inframammary fold along the margin of the prior lumpectomy scar, biopsy: Grade 2 ILC ER 95%, PR 0%, Ki67 20%, HER2 0 Treatment plan: Repeat lumpectomy followed by evaluation for radiation and antiestrogen therapy  Return to clinic after surgery

## 2023-12-27 ENCOUNTER — Encounter (HOSPITAL_BASED_OUTPATIENT_CLINIC_OR_DEPARTMENT_OTHER): Payer: Self-pay | Admitting: General Surgery

## 2023-12-30 ENCOUNTER — Ambulatory Visit: Admitting: Hematology and Oncology

## 2023-12-30 MED ORDER — CHLORHEXIDINE GLUCONATE CLOTH 2 % EX PADS: 6.0000 | MEDICATED_PAD | Freq: Once | CUTANEOUS | Status: DC

## 2023-12-30 MED ORDER — ENSURE PRE-SURGERY PO LIQD: 296.0000 mL | Freq: Once | ORAL | Status: DC

## 2023-12-30 NOTE — Progress Notes (Signed)

## 2024-01-04 ENCOUNTER — Ambulatory Visit (HOSPITAL_BASED_OUTPATIENT_CLINIC_OR_DEPARTMENT_OTHER): Admitting: Certified Registered Nurse Anesthetist

## 2024-01-04 ENCOUNTER — Ambulatory Visit (HOSPITAL_BASED_OUTPATIENT_CLINIC_OR_DEPARTMENT_OTHER)
Admission: RE | Admit: 2024-01-04 | Discharge: 2024-01-04 | Disposition: A | Discharge status: Home / Self Care | Attending: General Surgery | Admitting: General Surgery

## 2024-01-04 ENCOUNTER — Other Ambulatory Visit: Payer: Self-pay

## 2024-01-04 ENCOUNTER — Encounter (HOSPITAL_BASED_OUTPATIENT_CLINIC_OR_DEPARTMENT_OTHER): Payer: Self-pay | Admitting: General Surgery

## 2024-01-04 ENCOUNTER — Encounter (HOSPITAL_BASED_OUTPATIENT_CLINIC_OR_DEPARTMENT_OTHER)
Admission: RE | Disposition: A | Discharge status: Home / Self Care | Payer: Self-pay | Source: Home / Self Care | Attending: General Surgery

## 2024-01-04 DIAGNOSIS — C50912 Malignant neoplasm of unspecified site of left female breast: Secondary | ICD-10-CM

## 2024-01-04 DIAGNOSIS — Z01818 Encounter for other preprocedural examination: Secondary | ICD-10-CM

## 2024-01-04 DIAGNOSIS — Z87891 Personal history of nicotine dependence: Secondary | ICD-10-CM | POA: Insufficient documentation

## 2024-01-04 DIAGNOSIS — Z170 Estrogen receptor positive status [ER+]: Secondary | ICD-10-CM | POA: Diagnosis not present

## 2024-01-04 DIAGNOSIS — C44591 Other specified malignant neoplasm of skin of breast: Secondary | ICD-10-CM | POA: Insufficient documentation

## 2024-01-04 DIAGNOSIS — Z1732 Human epidermal growth factor receptor 2 negative status: Secondary | ICD-10-CM | POA: Diagnosis not present

## 2024-01-04 DIAGNOSIS — L905 Scar conditions and fibrosis of skin: Secondary | ICD-10-CM | POA: Diagnosis not present

## 2024-01-04 DIAGNOSIS — C50212 Malignant neoplasm of upper-inner quadrant of left female breast: Secondary | ICD-10-CM | POA: Diagnosis not present

## 2024-01-04 DIAGNOSIS — I341 Nonrheumatic mitral (valve) prolapse: Secondary | ICD-10-CM | POA: Diagnosis not present

## 2024-01-04 DIAGNOSIS — Z1721 Progesterone receptor positive status: Secondary | ICD-10-CM | POA: Insufficient documentation

## 2024-01-04 HISTORY — PX: BREAST LUMPECTOMY: SHX2

## 2024-01-04 SURGERY — BREAST LUMPECTOMY
Anesthesia: General | Site: Breast | Laterality: Left

## 2024-01-04 MED ORDER — LACTATED RINGERS IV SOLN: INTRAVENOUS | Status: DC

## 2024-01-04 MED ORDER — FENTANYL CITRATE (PF) 100 MCG/2ML IJ SOLN
INTRAMUSCULAR | Status: AC
  Filled 2024-01-04: qty 2

## 2024-01-04 MED ORDER — EPHEDRINE 5 MG/ML INJ
INTRAVENOUS | Status: AC
  Filled 2024-01-04: qty 10

## 2024-01-04 MED ORDER — ROCURONIUM BROMIDE 10 MG/ML (PF) SYRINGE
PREFILLED_SYRINGE | INTRAVENOUS | Status: AC
  Filled 2024-01-04: qty 10

## 2024-01-04 MED ORDER — ONDANSETRON HCL 4 MG/2ML IJ SOLN
INTRAMUSCULAR | Status: DC | PRN
  Administered 2024-01-04: 4 mg via INTRAVENOUS

## 2024-01-04 MED ORDER — ONDANSETRON HCL 4 MG/2ML IJ SOLN: 4.0000 mg | Freq: Four times a day (QID) | INTRAMUSCULAR | Status: DC | PRN

## 2024-01-04 MED ORDER — CEFAZOLIN SODIUM-DEXTROSE 2-4 GM/100ML-% IV SOLN
2.0000 g | INTRAVENOUS | Status: AC
Start: 2024-01-04 — End: 2024-01-04
  Administered 2024-01-04: 2 g via INTRAVENOUS

## 2024-01-04 MED ORDER — ACETAMINOPHEN 500 MG PO TABS
1000.0000 mg | ORAL_TABLET | ORAL | Status: AC
  Administered 2024-01-04: 1000 mg via ORAL

## 2024-01-04 MED ORDER — 0.9 % SODIUM CHLORIDE (POUR BTL) OPTIME
TOPICAL | Status: DC | PRN
  Administered 2024-01-04: 120 mL

## 2024-01-04 MED ORDER — ACETAMINOPHEN 500 MG PO TABS
ORAL_TABLET | ORAL | Status: AC
  Filled 2024-01-04: qty 2

## 2024-01-04 MED ORDER — FENTANYL CITRATE (PF) 100 MCG/2ML IJ SOLN: 25.0000 ug | INTRAMUSCULAR | Status: DC | PRN

## 2024-01-04 MED ORDER — CEFAZOLIN SODIUM-DEXTROSE 2-4 GM/100ML-% IV SOLN
INTRAVENOUS | Status: AC
  Filled 2024-01-04: qty 100

## 2024-01-04 MED ORDER — LIDOCAINE HCL (CARDIAC) PF 100 MG/5ML IV SOSY
PREFILLED_SYRINGE | INTRAVENOUS | Status: DC | PRN
  Administered 2024-01-04: 50 mg via INTRAVENOUS

## 2024-01-04 MED ORDER — OXYCODONE HCL 5 MG/5ML PO SOLN: 5.0000 mg | Freq: Once | ORAL | Status: DC | PRN

## 2024-01-04 MED ORDER — PROPOFOL 500 MG/50ML IV EMUL
INTRAVENOUS | Status: DC | PRN
  Administered 2024-01-04: 100 ug/kg/min via INTRAVENOUS

## 2024-01-04 MED ORDER — FENTANYL CITRATE (PF) 100 MCG/2ML IJ SOLN
INTRAMUSCULAR | Status: DC | PRN
  Administered 2024-01-04: 25 ug via INTRAVENOUS

## 2024-01-04 MED ORDER — DEXAMETHASONE SODIUM PHOSPHATE 4 MG/ML IJ SOLN
INTRAMUSCULAR | Status: DC | PRN
  Administered 2024-01-04: 4 mg via INTRAVENOUS

## 2024-01-04 MED ORDER — PROPOFOL 10 MG/ML IV BOLUS
INTRAVENOUS | Status: AC
  Filled 2024-01-04: qty 20

## 2024-01-04 MED ORDER — ONDANSETRON HCL 4 MG/2ML IJ SOLN
INTRAMUSCULAR | Status: AC
  Filled 2024-01-04: qty 2

## 2024-01-04 MED ORDER — PROPOFOL 10 MG/ML IV BOLUS
INTRAVENOUS | Status: DC | PRN
  Administered 2024-01-04: 20 mg via INTRAVENOUS
  Administered 2024-01-04: 100 mg via INTRAVENOUS

## 2024-01-04 MED ORDER — PHENYLEPHRINE 80 MCG/ML (10ML) SYRINGE FOR IV PUSH (FOR BLOOD PRESSURE SUPPORT)
PREFILLED_SYRINGE | INTRAVENOUS | Status: AC
  Filled 2024-01-04: qty 10

## 2024-01-04 MED ORDER — BUPIVACAINE HCL (PF) 0.25 % IJ SOLN
INTRAMUSCULAR | Status: DC | PRN
  Administered 2024-01-04: 10 mL

## 2024-01-04 MED ORDER — PHENYLEPHRINE HCL (PRESSORS) 10 MG/ML IV SOLN
INTRAVENOUS | Status: DC | PRN
  Administered 2024-01-04 (×2): 80 ug via INTRAVENOUS

## 2024-01-04 MED ORDER — OXYCODONE HCL 5 MG PO TABS: 5.0000 mg | ORAL_TABLET | Freq: Once | ORAL | Status: DC | PRN

## 2024-01-04 SURGICAL SUPPLY — 46 items
BINDER BREAST LRG (GAUZE/BANDAGES/DRESSINGS) IMPLANT
BINDER BREAST MEDIUM (GAUZE/BANDAGES/DRESSINGS) IMPLANT
BINDER BREAST XLRG (GAUZE/BANDAGES/DRESSINGS) IMPLANT
BINDER BREAST XXLRG (GAUZE/BANDAGES/DRESSINGS) IMPLANT
BLADE SURG 15 STRL LF DISP TIS (BLADE) ×1 IMPLANT
CANISTER SUCT 1200ML W/VALVE (MISCELLANEOUS) IMPLANT
CHLORAPREP W/TINT 26 (MISCELLANEOUS) ×1 IMPLANT
CLIP APPLIE 9.375 MED OPEN (MISCELLANEOUS) IMPLANT
CLIP TI WIDE RED SMALL 6 (CLIP) IMPLANT
COVER BACK TABLE 60X90IN (DRAPES) ×1 IMPLANT
COVER MAYO STAND STRL (DRAPES) ×1 IMPLANT
DERMABOND ADVANCED .7 DNX12 (GAUZE/BANDAGES/DRESSINGS) IMPLANT
DRAPE LAPAROSCOPIC ABDOMINAL (DRAPES) ×1 IMPLANT
DRAPE UTILITY XL STRL (DRAPES) ×1 IMPLANT
DRSG TEGADERM 4X4.75 (GAUZE/BANDAGES/DRESSINGS) ×1 IMPLANT
ELECT COATED BLADE 2.86 ST (ELECTRODE) ×1 IMPLANT
ELECTRODE BLDE 4.0 EZ CLN MEGD (MISCELLANEOUS) IMPLANT
ELECTRODE REM PT RTRN 9FT ADLT (ELECTROSURGICAL) ×1 IMPLANT
GAUZE SPONGE 4X4 12PLY STRL LF (GAUZE/BANDAGES/DRESSINGS) ×1 IMPLANT
GLOVE BIO SURGEON STRL SZ7 (GLOVE) ×1 IMPLANT
GLOVE BIOGEL PI IND STRL 7.5 (GLOVE) ×1 IMPLANT
GOWN STRL REUS W/ TWL LRG LVL3 (GOWN DISPOSABLE) ×3 IMPLANT
HEMOSTAT ARISTA ABSORB 3G PWDR (HEMOSTASIS) IMPLANT
KIT MARKER MARGIN INK (KITS) ×1 IMPLANT
NDL HYPO 25X1 1.5 SAFETY (NEEDLE) ×1 IMPLANT
NEEDLE HYPO 25X1 1.5 SAFETY (NEEDLE) ×1 IMPLANT
NS IRRIG 1000ML POUR BTL (IV SOLUTION) IMPLANT
PACK BASIN DAY SURGERY FS (CUSTOM PROCEDURE TRAY) ×1 IMPLANT
PENCIL SMOKE EVACUATOR (MISCELLANEOUS) ×1 IMPLANT
RETRACTOR ONETRAX LX 90X20 (MISCELLANEOUS) IMPLANT
SLEEVE SCD COMPRESS KNEE MED (STOCKING) ×1 IMPLANT
SPIKE FLUID TRANSFER (MISCELLANEOUS) IMPLANT
SPONGE T-LAP 4X18 ~~LOC~~+RFID (SPONGE) ×1 IMPLANT
STRIP CLOSURE SKIN 1/2X4 (GAUZE/BANDAGES/DRESSINGS) IMPLANT
SUT MNCRL AB 4-0 PS2 18 (SUTURE) IMPLANT
SUT MON AB 5-0 PS2 18 (SUTURE) IMPLANT
SUT SILK 2 0 SH (SUTURE) ×1 IMPLANT
SUT VIC AB 2-0 SH 27XBRD (SUTURE) ×1 IMPLANT
SUT VIC AB 3-0 SH 27X BRD (SUTURE) ×1 IMPLANT
SUT VIC AB 5-0 PS2 18 (SUTURE) IMPLANT
SUT VICRYL AB 3 0 TIES (SUTURE) IMPLANT
SYR CONTROL 10ML LL (SYRINGE) ×1 IMPLANT
TOWEL GREEN STERILE FF (TOWEL DISPOSABLE) ×1 IMPLANT
TRAY FAXITRON CT DISP (TRAY / TRAY PROCEDURE) IMPLANT
TUBE CONNECTING 20X1/4 (TUBING) IMPLANT
YANKAUER SUCT BULB TIP NO VENT (SUCTIONS) IMPLANT

## 2024-01-04 NOTE — Transfer of Care (Signed)
 Immediate Anesthesia Transfer of Care Note  Patient: Becky Ramirez  Procedure(s) Performed: BREAST LUMPECTOMY (Left: Breast)  Patient Location: PACU  Anesthesia Type:General  Level of Consciousness: awake, alert , and oriented  Airway & Oxygen Therapy: Patient Spontanous Breathing and Patient connected to face mask oxygen  Post-op Assessment: Report given to RN and Post -op Vital signs reviewed and stable  Post vital signs: Reviewed and stable  Last Vitals:  Vitals Value Taken Time  BP 132/60 01/04/24 10:38  Temp    Pulse 58 01/04/24 10:39  Resp    SpO2 100 % 01/04/24 10:39  Vitals shown include unfiled device data.  Last Pain:  Vitals:   01/04/24 0917  TempSrc: Temporal  PainSc: 0-No pain      Patients Stated Pain Goal: 3 (01/04/24 0917)  Complications: No notable events documented.

## 2024-01-04 NOTE — H&P (Signed)
  88 year old female who had a mass just below the IM fold in her left breast. I excised this with an attempt to get a clear margin as I was concerned about it. 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 Carcinoma does involve the inked deep margin. This is 90% ER positive, 10% PR positive, HER2 negative and the Ki-67 is 35%. I returned her to the OR to clear these margins. She had no additional tumor and her margins are all now well clear. She elected to forgo radiotherapy and has been on anastrozole . Has had a benign right breast biopsy since then and then recently noted a mass near medial aspect of her incision. Left axillary us  is negative. Biopsy is grade II ILC that is er pos, pr neg, ki 20 and her 2 negative. She is here with her son to discuss options  Review of Systems: A complete review of systems was obtained from the patient. I have reviewed this information and discussed as appropriate with the patient. See HPI as well for other [place].  Review of Systems  All other systems reviewed and are negative.   Medical History: History reviewed. No pertinent past medical history.   Past Surgical History:  Procedure Laterality Date  MASTECTOMY PARTIAL / LUMPECTOMY Left  TONSILLECTOMY   No Known Allergies  Current Outpatient Medications on File Prior to Visit  Medication Sig Dispense Refill  calcium carbonate 600 mg calcium (1,500 mg) Tab tablet Take by mouth  cholecalciferol, vitamin D3, (VITAMIN D3) 125 mcg (5,000 unit) tablet Take 50 mcg by mouth once daily  multivitamin tablet Take 1 tablet by mouth once daily   Family History  Problem Relation Age of Onset  High blood pressure (Hypertension) Mother  Colon cancer Father    Social History   Tobacco Use  Smoking Status Former  Types: Cigarettes  Smokeless Tobacco Not on file Marital status: Widowed  Tobacco Use  Smoking status: Former  Types: Cigarettes  Vaping Use  Vaping status: Unknown  Substance  and Sexual Activity  Alcohol use: Yes  Drug use: Never  Objective:   Physical Exam Constitutional:  Appearance: Normal appearance.  Chest:  Breasts: Right: No mass.  Left: No inverted nipple, mass or nipple discharge.  Comments: Small mass at medial aspect of scar Lymphadenopathy:  Upper Body:  Left upper body: No supraclavicular or axillary adenopathy.  Neurological:  Mental Status: She is alert.   Assessment and Plan:   Recurrent left breast cancer  Left breast lumpectomy  We discussed again the recurrence and the fact that this does happen in a certain amount of patients. I think we need to do something differently than last time. There is no real benefit to a mastectomy for her. This is not even really in her breast tissue is below the inframammary fold but clearly is breast cancer again. I do think a lumpectomy again is very reasonable. We discussed the positive margin risk. We can do this with ultrasound guidance. Her lymph nodes are negative and I do not think she needs lymph node evaluation at this time either. I do think continuing an antiestrogen is a reasonable idea but I also think that radiotherapy this time would be recommended. I am going to send a message to both medical and radiation oncology and have her see them postoperatively. We discussed surgery as well as the risks and recovery we will plan to schedule soon.

## 2024-01-04 NOTE — Anesthesia Postprocedure Evaluation (Signed)
 Anesthesia Post Note  Patient: Becky Ramirez  Procedure(s) Performed: BREAST LUMPECTOMY (Left: Breast)     Patient location during evaluation: PACU Anesthesia Type: General Level of consciousness: awake and alert Pain management: pain level controlled Vital Signs Assessment: post-procedure vital signs reviewed and stable Respiratory status: spontaneous breathing, nonlabored ventilation, respiratory function stable and patient connected to nasal cannula oxygen Cardiovascular status: blood pressure returned to baseline and stable Postop Assessment: no apparent nausea or vomiting Anesthetic complications: no   No notable events documented.  Last Vitals:  Vitals:   01/04/24 1100 01/04/24 1115  BP: (!) 163/73 104/65  Pulse: (!) 57 80  Resp: 11 16  Temp:  36.4 C  SpO2: 100% 98%    Last Pain:  Vitals:   01/04/24 1115  TempSrc:   PainSc: 0-No pain                 Briceida Rasberry S

## 2024-01-04 NOTE — Discharge Instructions (Addendum)
 [facility] Office Phone Number [phone]  POST OP INSTRUCTIONS Take 400 mg of ibuprofen every 8 hours or 650 mg tylenol  every 6 hours for next 72 hours then as needed. Use ice several times daily also.  A prescription for pain medication may be given to you upon discharge.  Take your pain medication as prescribed, if needed.  If narcotic pain medicine is not needed, then you may take acetaminophen  (Tylenol ), naprosyn (Alleve) or ibuprofen (Advil) as needed. Take your usually prescribed medications unless otherwise directed If you need a refill on your pain medication, please contact your pharmacy.  They will contact our office to request authorization.  Prescriptions will not be filled after 5pm or on week-ends. You should eat very light the first 24 hours after surgery, such as soup, crackers, pudding, etc.  Resume your normal diet the day after surgery. Most patients will experience some swelling and bruising in the breast.  Ice packs and a good support bra will help.  Wear the breast binder provided or a sports bra for 72 hours day and night.  After that wear a sports bra during the day until you return to the office. Swelling and bruising can take several days to resolve.  It is common to experience some constipation if taking pain medication after surgery.  Increasing fluid intake and taking a stool softener will usually help or prevent this problem from occurring.  A mild laxative (Milk of Magnesia or Miralax) should be taken according to package directions if there are no bowel movements after 48 hours. I used skin glue on the incision, you may shower in 24 hours.  The glue will flake off over the next 2-3 weeks as will the steristrips.   Any sutures or staples will be removed at the office during your follow-up visit. ACTIVITIES:  You may resume regular daily activities (gradually increasing) beginning the next day.  Wearing a good support bra or sports bra minimizes pain and  swelling.  You may have sexual intercourse when it is comfortable. You may drive when you no longer are taking prescription pain medication, you can comfortably wear a seatbelt, and you can safely maneuver your car and apply brakes. RETURN TO WORK:  ______________________________________________________________________________________ Rosine should see your doctor in the office for a follow-up appointment approximately two to three weeks after your surgery.  Your doctor's nurse will typically make your follow-up appointment when she calls you with your pathology report.  Expect your pathology report 3-4 business days after your surgery.  You may call to check if you do not hear from us  after three days.  WHEN TO CALL DR WAKEFIELD: Fever over 101.0 Nausea and/or vomiting. Extreme swelling or bruising. Continued bleeding from incision. Increased pain, redness, or drainage from the incision.  The clinic staff is available to answer your questions during regular business hours.  Please don't hesitate to call and ask to speak to one of the nurses for clinical concerns.  If you have a medical emergency, go to the nearest emergency room or call 911.  A surgeon from [facility] is always on call at the hospital.  For further questions, please visit [email or web address] mcw NO tylenol  until 3:20 p.m.  Post Anesthesia Home Care Instructions  Activity: Get plenty of rest for the remainder of the day. A responsible individual must stay with you for 24 hours following the procedure.  For the next 24 hours, DO NOT: -Drive a car -Advertising copywriter -Drink alcoholic beverages -Take any  medication unless instructed by your physician -Make any legal decisions or sign important papers.  Meals: Start with liquid foods such as gelatin or soup. Progress to regular foods as tolerated. Avoid greasy, spicy, heavy foods. If nausea and/or vomiting occur, drink only clear liquids until the nausea  and/or vomiting subsides. Call your physician if vomiting continues.  Special Instructions/Symptoms: Your throat may feel dry or sore from the anesthesia or the breathing tube placed in your throat during surgery. If this causes discomfort, gargle with warm salt water. The discomfort should disappear within 24 hours.  If you had a scopolamine patch placed behind your ear for the management of post- operative nausea and/or vomiting:  1. The medication in the patch is effective for 72 hours, after which it should be removed.  Wrap patch in a tissue and discard in the trash. Wash hands thoroughly with soap and water. 2. You may remove the patch earlier than 72 hours if you experience unpleasant side effects which may include dry mouth, dizziness or visual disturbances. 3. Avoid touching the patch. Wash your hands with soap and water after contact with the patch.

## 2024-01-04 NOTE — Anesthesia Procedure Notes (Signed)
 Procedure Name: LMA Insertion Date/Time: 01/04/2024 9:51 AM  Performed by: Buster Catheryn SAUNDERS, CRNAPre-anesthesia Checklist: Patient identified, Emergency Drugs available, Suction available and Patient being monitored Patient Re-evaluated:Patient Re-evaluated prior to induction Oxygen Delivery Method: Circle system utilized Preoxygenation: Pre-oxygenation with 100% oxygen Induction Type: IV induction Ventilation: Mask ventilation without difficulty LMA: LMA inserted LMA Size: 3.0 Number of attempts: 1 Airway Equipment and Method: Bite block Placement Confirmation: positive ETCO2 Tube secured with: Tape Dental Injury: Teeth and Oropharynx as per pre-operative assessment

## 2024-01-04 NOTE — Anesthesia Preprocedure Evaluation (Signed)
 Anesthesia Evaluation  Patient identified by MRN, date of birth, ID band Patient awake    Reviewed: Allergy & Precautions, H&P , NPO status , Patient's Chart, lab work & pertinent test results  Airway Mallampati: II   Neck ROM: full    Dental   Pulmonary former smoker   breath sounds clear to auscultation       Cardiovascular + Valvular Problems/Murmurs MVP  Rhythm:regular Rate:Normal     Neuro/Psych    GI/Hepatic   Endo/Other    Renal/GU      Musculoskeletal   Abdominal   Peds  Hematology   Anesthesia Other Findings   Reproductive/Obstetrics Breast CA                              Anesthesia Physical Anesthesia Plan  ASA: 2  Anesthesia Plan: General   Post-op Pain Management:    Induction: Intravenous  PONV Risk Score and Plan: 3 and Ondansetron , Dexamethasone  and Treatment may vary due to age or medical condition  Airway Management Planned: LMA  Additional Equipment:   Intra-op Plan:   Post-operative Plan: Extubation in OR  Informed Consent: I have reviewed the patients History and Physical, chart, labs and discussed the procedure including the risks, benefits and alternatives for the proposed anesthesia with the patient or authorized representative who has indicated his/her understanding and acceptance.     Dental advisory given  Plan Discussed with: CRNA, Anesthesiologist and Surgeon  Anesthesia Plan Comments:         Anesthesia Quick Evaluation

## 2024-01-04 NOTE — Interval H&P Note (Signed)
 History and Physical Interval Note:  01/04/2024 9:25 AM  Becky Ramirez  has presented today for surgery, with the diagnosis of BREAST CANCER.  The various methods of treatment have been discussed with the patient and family. After consideration of risks, benefits and other options for treatment, the patient has consented to  Procedure(s) with comments: BREAST LUMPECTOMY (Left) - LMA LEFT BREAST LUMPECTOMY as a surgical intervention.  The patient's history has been reviewed, patient examined, no change in status, stable for surgery.  I have reviewed the patient's chart and labs.  Questions were answered to the patient's satisfaction.     [name]

## 2024-01-04 NOTE — Op Note (Signed)
  Preoperative diagnosis: recurrent left breast cancer Postoperative diagnosis: Same as above Procedure: Left breast lumpectomy Surgeon: Dr. Adina Bury Anesthesia: General Specimens: Left lumpectomy marked short superior, long lateral, double deep Estimated blood loss: Minimal Complications: None Drains: None Sponge needle count was correct completion Disposition recovery in stable condition   Indications:86 yof otherwise healthy who has a chest wall mass present for several months that I excised and was breast cancer. She wanted to proceed with surgery alone. Now there is small mass at medial incision measuring 7 mm on US  that is recurrent disease. We discussed repeat excision with med onc and rad onc follow up afterwards.    Procedure: After informed consent was obtained she was taken to the operating room.  She was given antibiotics.  SCDs were placed.  She was placed under general anesthesia without complication.  She was prepped and draped in a standard sterile surgical fashion.  A surgical timeout was then performed.   I infiltrated Marcaine  around this area.  I then made an elliptical incision to encompass the mass and a rim of normal tissue as well as the skin overlying it.  I then took this down to the fascia.   I then was able to remove this in its entirety. I then passed this off the table as a specimen.  I then obtained hemostasis.  I then closed this with 2-0 Vicryl, 3-0 Vicryl, and 4-0 Monocryl.  Glue and Steri-Strips were applied.  She tolerated this well was transferred recovery stable

## 2024-01-05 ENCOUNTER — Encounter (HOSPITAL_BASED_OUTPATIENT_CLINIC_OR_DEPARTMENT_OTHER): Payer: Self-pay | Admitting: General Surgery

## 2024-01-06 LAB — SURGICAL PATHOLOGY

## 2024-01-10 ENCOUNTER — Encounter: Payer: Self-pay | Admitting: *Deleted

## 2024-01-17 ENCOUNTER — Telehealth: Payer: Self-pay | Admitting: *Deleted

## 2024-01-17 ENCOUNTER — Inpatient Hospital Stay

## 2024-01-17 ENCOUNTER — Inpatient Hospital Stay: Attending: Hematology and Oncology | Admitting: Hematology and Oncology

## 2024-01-17 VITALS — BP 126/64 | HR 80 | Temp 97.6°F | Resp 16 | Ht 63.0 in | Wt 118.4 lb

## 2024-01-17 DIAGNOSIS — Z170 Estrogen receptor positive status [ER+]: Secondary | ICD-10-CM | POA: Insufficient documentation

## 2024-01-17 DIAGNOSIS — Z1722 Progesterone receptor negative status: Secondary | ICD-10-CM | POA: Insufficient documentation

## 2024-01-17 DIAGNOSIS — Z1732 Human epidermal growth factor receptor 2 negative status: Secondary | ICD-10-CM | POA: Insufficient documentation

## 2024-01-17 DIAGNOSIS — Z79811 Long term (current) use of aromatase inhibitors: Secondary | ICD-10-CM | POA: Insufficient documentation

## 2024-01-17 DIAGNOSIS — C50512 Malignant neoplasm of lower-outer quadrant of left female breast: Secondary | ICD-10-CM | POA: Diagnosis not present

## 2024-01-17 LAB — CBC WITH DIFFERENTIAL (CANCER CENTER ONLY)
Abs Immature Granulocytes: 0.01 K/uL (ref 0.00–0.07)
Basophils Absolute: 0 K/uL (ref 0.0–0.1)
Basophils Relative: 1 %
Eosinophils Absolute: 0 K/uL (ref 0.0–0.5)
Eosinophils Relative: 1 %
HCT: 38.2 % (ref 36.0–46.0)
Hemoglobin: 12.9 g/dL (ref 12.0–15.0)
Immature Granulocytes: 0 %
Lymphocytes Relative: 30 %
Lymphs Abs: 1.3 K/uL (ref 0.7–4.0)
MCH: 30.8 pg (ref 26.0–34.0)
MCHC: 33.8 g/dL (ref 30.0–36.0)
MCV: 91.2 fL (ref 80.0–100.0)
Monocytes Absolute: 0.4 K/uL (ref 0.1–1.0)
Monocytes Relative: 8 %
Neutro Abs: 2.6 K/uL (ref 1.7–7.7)
Neutrophils Relative %: 60 %
Platelet Count: 177 K/uL (ref 150–400)
RBC: 4.19 MIL/uL (ref 3.87–5.11)
RDW: 12.7 % (ref 11.5–15.5)
WBC Count: 4.4 K/uL (ref 4.0–10.5)
nRBC: 0 % (ref 0.0–0.2)

## 2024-01-17 LAB — CMP (CANCER CENTER ONLY)
ALT: 14 U/L (ref 0–44)
AST: 17 U/L (ref 15–41)
Albumin: 4.1 g/dL (ref 3.5–5.0)
Alkaline Phosphatase: 38 U/L (ref 38–126)
Anion gap: 3 — ABNORMAL LOW (ref 5–15)
BUN: 16 mg/dL (ref 8–23)
CO2: 34 mmol/L — ABNORMAL HIGH (ref 22–32)
Calcium: 9.7 mg/dL (ref 8.9–10.3)
Chloride: 104 mmol/L (ref 98–111)
Creatinine: 0.7 mg/dL (ref 0.44–1.00)
GFR, Estimated: >60 mL/min (ref >60)
Glucose, Bld: 108 mg/dL — ABNORMAL HIGH (ref 70–99)
Potassium: 3.9 mmol/L (ref 3.5–5.1)
Sodium: 141 mmol/L (ref 135–145)
Total Bilirubin: 0.9 mg/dL (ref 0.0–1.2)
Total Protein: 6.5 g/dL (ref 6.5–8.1)

## 2024-01-17 NOTE — Telephone Encounter (Signed)
 Scheduled patient for CT CAP on Friday 01/21/24 at 1030. Per Radiology scheduling - pt arrive [facility] at 1000. Recommend light breakfast or liquids as IV contrast can cause nausea for some. No metal buttons or zippers. Contacted patient with all information. She verbalized understanding of all directions.

## 2024-01-17 NOTE — Assessment & Plan Note (Signed)
 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, involves dermis, negative for LVI, ER 90%, PR 10%, Ki67 35%, HER2 0 negative  03/04/2023: Left anterior and posterior margin excision: Benign T2 N0 stage Ib Patient decided against radiation   Treatment plan: Adjuvant antiestrogen therapy with anastrozole  1 mg daily x 5 years  Breast cancer surveillance: Breast exam 01/17/2024: Benign Mammogram 12/01/2023 with ultrasound: Suspicious 7 mm irregular mass inferior to the left breast inframammary fold: Biopsy: Grade 2 ILC, ER 95%, PR 0%, Ki67 20%, HER2 0 Left lumpectomy: 01/04/2024: ILC with LCIS, margins negative: ER 95%, PR 0%, Ki67 20%, HER2 -0  Discussion of pros and cons of radiation: Recommend radiation oncology consultation Recommendation: Continue antiestrogen therapy

## 2024-01-17 NOTE — Progress Notes (Signed)
 Patient Care Team: Seabron Lenis, MD as PCP - General (Family Medicine) Odean Potts, MD as Consulting Physician (Hematology and Oncology) Shannon Agent, MD as Consulting Physician (Radiation Oncology) Ebbie Cough, MD as Consulting Physician (General Surgery)  DIAGNOSIS:  Encounter Diagnosis  Name Primary?   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, involves dermis, negative for LVI, ER 90%, PR 10%, Ki67 35%, HER2 0 negative   02/15/2023 Cancer Staging   Staging form: Breast, AJCC 8th Edition - Pathologic stage from 02/15/2023: Stage IA (pT2, pN0, cM0, G2, ER+, PR+, HER2-) - Signed by Crawford Morna Pickle, NP on 06/28/2023 Stage prefix: Initial diagnosis Histologic grading system: 3 grade system   03/03/2023 Cancer Staging   Staging form: Breast, AJCC 8th Edition - Clinical: Stage IB (cT2, cN0, cM0, G2, ER+, PR+, HER2-) - Signed by Odean Potts, MD on 03/03/2023 Histologic grading system: 3 grade system   03/06/2023 Surgery   Reexcision cleared margins   03/2023 -  Anti-estrogen oral therapy   1 mg Anastrozole  x 5 years     CHIEF COMPLIANT: Follow-up on anastrozole  therapy  HISTORY OF PRESENT ILLNESS:  History of Present Illness Becky Ramirez is an 88 year old female with recurrent breast cancer who presents for follow-up after surgery.  She underwent surgery for invasive breast cancer, which was estrogen receptor positive and progesterone receptor negative, graded as three, and measured one centimeter. She is currently on an anti-estrogen medication. The cancer recurred in less than a year. She is scheduled for  radiation therapy and a CT scan. A blood sample will be taken to assess kidney function prior to the CT scan.     ALLERGIES:  has no known allergies.  MEDICATIONS:  Current Outpatient Medications  Medication Sig Dispense Refill   anastrozole  (ARIMIDEX ) 1 MG tablet Take 1 tablet (1 mg total) by mouth daily. 90 tablet 3   CALCIUM PO Take 1 tablet by mouth daily.     cetirizine (ZYRTEC) 10 MG tablet Take 10 mg by mouth at bedtime.     denosumab  (PROLIA ) 60 MG/ML SOSY injection Inject 60 mg into the skin every 6 (six) months.     Multiple Vitamin (MULTIVITAMIN) tablet Take 1 tablet by mouth daily.     VITAMIN D PO Take 1 capsule by mouth daily.     No current facility-administered medications for this visit.    PHYSICAL EXAMINATION: ECOG PERFORMANCE STATUS: 1 - Symptomatic but completely ambulatory  Vitals:   01/17/24 1123  BP: 126/64  Pulse: 80  Resp: 16  Temp: 97.6 F (36.4 C)  SpO2: 99%   Filed Weights   01/17/24 1123  Weight: 118 lb 6.4 oz (53.7 kg)    Physical Exam   (exam performed in the presence of a chaperone)  LABORATORY DATA:  I have reviewed the data as listed    Latest Ref Rng & Units 03/04/2023    5:51 AM 04/21/2018   10:51 AM  CMP  Glucose 70 - 99 mg/dL 898  894   BUN 8 - 23 mg/dL 12  12   Creatinine 9.55 - 1.00 mg/dL 9.14  9.24   Sodium 864 - 145 mmol/L  140  141   Potassium 3.5 - 5.1 mmol/L 3.3  4.8   Chloride 98 - 111 mmol/L 106  107   CO2 22 - 32 mmol/L 27  28   Calcium 8.9 - 10.3 mg/dL 8.8  9.1   Total Protein 6.5 - 8.1 g/dL  6.2   Total Bilirubin 0.3 - 1.2 mg/dL  1.1   Alkaline Phos 38 - 126 U/L  52   AST 15 - 41 U/L  16   ALT 0 - 44 U/L  15     Lab Results  Component Value Date   WBC 3.0 (L) 03/04/2023   HGB 11.9 (L) 03/04/2023   HCT 35.8 (L) 03/04/2023   MCV 91.3 03/04/2023   PLT 148 (L) 03/04/2023   NEUTROABS 2.1 04/21/2018    ASSESSMENT & PLAN:  Malignant neoplasm of lower-outer quadrant of left breast of female, estrogen  receptor positive (HCC) 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, involves dermis, negative for LVI, ER 90%, PR 10%, Ki67 35%, HER2 0 negative  03/04/2023: Left anterior and posterior margin excision: Benign T2 N0 stage Ib Patient decided against radiation   Treatment plan: Adjuvant antiestrogen therapy with anastrozole  1 mg daily x 5 years  Breast cancer surveillance: Breast exam 01/17/2024: Benign Mammogram 12/01/2023 with ultrasound: Suspicious 7 mm irregular mass inferior to the left breast inframammary fold: Biopsy: Grade 2 ILC, ER 95%, PR 0%, Ki67 20%, HER2 0 Left lumpectomy: 01/04/2024:  Grade 3 ILC 1 cm with LCIS, margins negative: ER 95%, PR 0%, Ki67 20%, HER2 -0  Discussion of pros and cons of radiation: Recommend radiation oncology consultation.  Patient is willing to undergo radiation. We will perform a CT scan chest abdomen pelvis by Friday.  She wanted to make sure she does not have distant metastatic disease before she undergoes radiation.  Recommendation: Continue antiestrogen therapy ------------------------------------- Assessment and Plan Assessment & Plan Recurrent estrogen receptor positive invasive carcinoma of the lower-outer quadrant of the left breast Recurrent invasive carcinoma, estrogen receptor positive, grade 3, negative progesterone receptor. Recurrence in less than a year necessitates aggressive treatment. Tumor size 1 cm. - Continue anastrozole  1 mg oral daily. - Proceed with radiation therapy. Radiation tangential to minimize heart and lung exposure. Radiation oncologist to discuss options for shared decision making. - Order CT scan neck to pelvis to assess for metastasis before radiation therapy. - Check kidney function prior to CT scan. - Schedule follow-up with radiation oncologist in a week. - Review CT results before radiation therapy. - Provider to call with CT results before  radiation therapy appointment.      No orders of the defined types were placed in this encounter.  The patient has a good understanding of the overall plan. she agrees with it. she will call with any problems that may develop before the next visit here. Total time spent: 30 mins including face to face time and time spent for planning, charting and co-ordination of care   Naomi MARLA Chad, MD 01/17/24

## 2024-01-19 NOTE — Progress Notes (Incomplete)
 Location of Breast Cancer:left breast   Histology per Pathology Report:    Receptor Status: ER(95), PR (neg), Her2-neu (), Ki-67(20)  Did patient present with symptoms (if so, please note symptoms) or was this found on screening mammography?: ***  Past/Anticipated interventions by surgeon, if any:    Past/Anticipated interventions by medical oncology, if any:  Dr. Odean   Lymphedema issues, if any:  {:18581} {t:21944}   Pain issues, if any:  {:18581} {PAIN DESCRIPTION:21022940}  SAFETY ISSUES: Prior radiation? {:18581} Pacemaker/ICD? {:18581} Possible current pregnancy?{:18581} Is the patient on methotrexate? {:18581}  Current Complaints / other details:  ***

## 2024-01-21 ENCOUNTER — Ambulatory Visit (HOSPITAL_COMMUNITY)
Admission: RE | Admit: 2024-01-21 | Discharge: 2024-01-21 | Disposition: A | Discharge status: Home / Self Care | Source: Ambulatory Visit | Attending: Hematology and Oncology | Admitting: Hematology and Oncology

## 2024-01-21 DIAGNOSIS — Z170 Estrogen receptor positive status [ER+]: Secondary | ICD-10-CM | POA: Diagnosis not present

## 2024-01-21 DIAGNOSIS — R932 Abnormal findings on diagnostic imaging of liver and biliary tract: Secondary | ICD-10-CM | POA: Diagnosis not present

## 2024-01-21 DIAGNOSIS — C50512 Malignant neoplasm of lower-outer quadrant of left female breast: Secondary | ICD-10-CM | POA: Insufficient documentation

## 2024-01-21 DIAGNOSIS — K769 Liver disease, unspecified: Secondary | ICD-10-CM | POA: Diagnosis not present

## 2024-01-21 DIAGNOSIS — R590 Localized enlarged lymph nodes: Secondary | ICD-10-CM | POA: Diagnosis not present

## 2024-01-21 DIAGNOSIS — I700 Atherosclerosis of aorta: Secondary | ICD-10-CM | POA: Diagnosis not present

## 2024-01-21 MED ORDER — IOHEXOL 300 MG/ML  SOLN
100.0000 mL | Freq: Once | INTRAMUSCULAR | Status: AC | PRN
  Administered 2024-01-21: 80 mL via INTRAVENOUS

## 2024-01-21 MED ORDER — SODIUM CHLORIDE (PF) 0.9 % IJ SOLN
INTRAMUSCULAR | Status: AC
  Filled 2024-01-21: qty 50

## 2024-01-24 ENCOUNTER — Ambulatory Visit: Admitting: Radiation Oncology

## 2024-01-24 ENCOUNTER — Ambulatory Visit

## 2024-01-26 ENCOUNTER — Ambulatory Visit: Admitting: Radiation Oncology

## 2024-01-27 ENCOUNTER — Inpatient Hospital Stay: Admitting: Hematology and Oncology

## 2024-01-27 VITALS — BP 132/62 | HR 80 | Temp 98.2°F | Resp 16 | Ht 63.0 in | Wt 117.1 lb

## 2024-01-27 DIAGNOSIS — C50512 Malignant neoplasm of lower-outer quadrant of left female breast: Secondary | ICD-10-CM | POA: Diagnosis not present

## 2024-01-27 DIAGNOSIS — Z170 Estrogen receptor positive status [ER+]: Secondary | ICD-10-CM

## 2024-01-27 NOTE — Assessment & Plan Note (Signed)
 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, involves dermis, negative for LVI, ER 90%, PR 10%, Ki67 35%, HER2 0 negative  03/04/2023: Left anterior and posterior margin excision: Benign T2 N0 stage Ib Patient decided against radiation   Treatment plan: Adjuvant antiestrogen therapy with anastrozole  1 mg daily x 5 years   Breast cancer surveillance: Breast exam 01/17/2024: Benign Mammogram 12/01/2023 with ultrasound: Suspicious 7 mm irregular mass inferior to the left breast inframammary fold: Biopsy: Grade 2 ILC, ER 95%, PR 0%, Ki67 20%, HER2 0 Left lumpectomy: 01/04/2024:  Grade 3 ILC 1 cm with LCIS, margins negative: ER 95%, PR 0%, Ki67 20%, HER2 -0   Discussion of pros and cons of radiation: Recommend radiation oncology consultation.  Patient is willing to undergo radiation. CT CAP 01/24/2024: Unchanged left axillary and subpectoral lymph nodes of uncertain significance.  No evidence of metastatic disease   Recommendation: Continue antiestrogen therapy

## 2024-01-27 NOTE — Progress Notes (Signed)
 Patient Care Team: Seabron Lenis, MD as PCP - General (Family Medicine) Odean Potts, MD as Consulting Physician (Hematology and Oncology) Shannon Agent, MD as Consulting Physician (Radiation Oncology) Ebbie Cough, MD as Consulting Physician (General Surgery)  DIAGNOSIS:  Encounter Diagnosis  Name Primary?   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, involves dermis, negative for LVI, ER 90%, PR 10%, Ki67 35%, HER2 0 negative   02/15/2023 Cancer Staging   Staging form: Breast, AJCC 8th Edition - Pathologic stage from 02/15/2023: Stage IA (pT2, pN0, cM0, G2, ER+, PR+, HER2-) - Signed by Crawford Morna Pickle, NP on 06/28/2023 Stage prefix: Initial diagnosis Histologic grading system: 3 grade system   03/03/2023 Cancer Staging   Staging form: Breast, AJCC 8th Edition - Clinical: Stage IB (cT2, cN0, cM0, G2, ER+, PR+, HER2-) - Signed by Odean Potts, MD on 03/03/2023 Histologic grading system: 3 grade system   03/06/2023 Surgery   Reexcision cleared margins   03/2023 -  Anti-estrogen oral therapy   1 mg Anastrozole  x 5 years     CHIEF COMPLIANT: Follow-up to discuss results of CT scan  HISTORY OF PRESENT ILLNESS:  History of Present Illness Becky Ramirez is an 88 year old female with breast cancer who presents for follow-up after recent surgery.  Recent imaging shows surgical changes with asymmetric prominence of the left axillary and subpectoral lymph nodes, which have remained unchanged since last year. Despite taking anastrozole , the cancer recurred at the original site. She expresses disappointment with the recurrence,  noting that anastrozole  often effectively reduces tumors without surgery. She receives a Prolia  injection biannually and takes a multivitamin. She experiences no issues with anastrozole  and is in good health overall, taking few medications.     ALLERGIES:  has no known allergies.  MEDICATIONS:  Current Outpatient Medications  Medication Sig Dispense Refill   anastrozole  (ARIMIDEX ) 1 MG tablet Take 1 tablet (1 mg total) by mouth daily. 90 tablet 3   CALCIUM PO Take 1 tablet by mouth daily.     cetirizine (ZYRTEC) 10 MG tablet Take 10 mg by mouth at bedtime.     denosumab  (PROLIA ) 60 MG/ML SOSY injection Inject 60 mg into the skin every 6 (six) months.     Multiple Vitamin (MULTIVITAMIN) tablet Take 1 tablet by mouth daily.     VITAMIN D PO Take 1 capsule by mouth daily.     No current facility-administered medications for this visit.    PHYSICAL EXAMINATION: ECOG PERFORMANCE STATUS: 1 - Symptomatic but completely ambulatory  Vitals:   01/27/24 1329  BP: 132/62  Pulse: 80  Resp: 16  Temp: 98.2 F (36.8 C)  SpO2: 99%   Filed Weights   01/27/24 1329  Weight: 117 lb 1.6 oz (53.1 kg)    Physical Exam   (exam performed in the presence of a chaperone)  LABORATORY DATA:  I have reviewed the data as listed    Latest Ref Rng & Units 01/17/2024   12:00 PM 03/04/2023    5:51 AM 04/21/2018   10:51 AM  CMP  Glucose 70 - 99 mg/dL 891  898  894   BUN 8 - 23 mg/dL  16  12  12    Creatinine 0.44 - 1.00 mg/dL 9.29  9.14  9.24   Sodium 135 - 145 mmol/L 141  140  141   Potassium 3.5 - 5.1 mmol/L 3.9  3.3  4.8   Chloride 98 - 111 mmol/L 104  106  107   CO2 22 - 32 mmol/L 34  27  28   Calcium 8.9 - 10.3 mg/dL 9.7  8.8  9.1   Total Protein 6.5 - 8.1 g/dL 6.5   6.2   Total Bilirubin 0.0 - 1.2 mg/dL 0.9   1.1   Alkaline Phos 38 - 126 U/L 38   52   AST 15 - 41 U/L 17   16   ALT 0 - 44 U/L 14   15     Lab Results  Component Value Date   WBC 4.4 01/17/2024   HGB 12.9 01/17/2024   HCT  38.2 01/17/2024   MCV 91.2 01/17/2024   PLT 177 01/17/2024   NEUTROABS 2.6 01/17/2024    ASSESSMENT & PLAN:  Malignant neoplasm of lower-outer quadrant of left breast of female, estrogen receptor positive (HCC) 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, involves dermis, negative for LVI, ER 90%, PR 10%, Ki67 35%, HER2 0 negative  03/04/2023: Left anterior and posterior margin excision: Benign T2 N0 stage Ib Patient decided against radiation   Treatment plan: Adjuvant antiestrogen therapy with anastrozole  1 mg daily x 5 years   Breast cancer recurrence: Mammogram 12/01/2023 with ultrasound: Suspicious 7 mm irregular mass inferior to the left breast inframammary fold: Biopsy: Grade 2 ILC, ER 95%, PR 0%, Ki67 20%, HER2 0 Left lumpectomy: 01/04/2024:  Grade 3 ILC 1 cm with LCIS, margins negative: ER 95%, PR 0%, Ki67 20%, HER2 -0   Discussion of pros and cons of radiation: Recommend radiation oncology consultation.  Patient is willing to undergo radiation. CT CAP 01/24/2024: Unchanged left axillary and subpectoral lymph nodes of uncertain significance.  No evidence of metastatic disease   Recommendation: Continue antiestrogen therapy   No orders of the defined types were placed in this encounter.  The patient has a good understanding of the overall plan. she agrees with it. she will call with any problems that may develop before the next visit here.  I personally spent a total of 30 minutes in the care of the patient today including preparing to see the patient, getting/reviewing separately obtained history, performing a medically appropriate exam/evaluation, counseling and educating, placing orders, referring and communicating with other health care professionals, documenting clinical information in the EHR, independently interpreting results, communicating results, and coordinating care.   Viinay K Elizabeth Paulsen,  MD 01/27/24

## 2024-01-28 NOTE — Progress Notes (Signed)
   PROVIDER:  DONNICE CARLIN BURY, MD  MRN: [id number] DOB: Apr 14, 1935 DATE OF ENCOUNTER: 01/28/2024 Interval History:     This is an 88 year old female who had a recurrent left breast cancer.  This is actually below her inframammary fold.  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  Her margins are all clear.  This was estrogen receptor positive and progesterone receptor negative.  She is doing well after surgery.  She is due to see radiation oncology soon.  She is going to remain on anastrozole     Physical Examination:   Physical Exam   Incision healing well without any evidence of infection   Assessment and Plan:     She is doing well.  She is released to full activity.  She will follow-up with radiation oncology and medical oncology and I will see her back in 1 year.  MATTHEW CARLIN BURY, MD

## 2024-01-28 NOTE — Progress Notes (Signed)
 Location of Breast Cancer:left   Histology per Pathology Report:    Receptor Status: ER(95), PR (negative), Her2-neu (), Ki-67(20)  Did patient present with symptoms (if so, please note symptoms) or was this found on screening mammography?: mammogram   Past/Anticipated interventions by surgeon, if jwb:ozqu    Past/Anticipated interventions by medical oncology, if any: Dr. Odean     Skin: Intact Pain issues, if any:  no    SAFETY ISSUES: Prior radiation? no Pacemaker/ICD? no Possible current pregnancy?no Is the patient on methotrexate? no  Current Complaints / other details:    BP (!) 166/75 (BP Location: Left Arm, Patient Position: Sitting, Cuff Size: Normal)   Pulse 78   Temp (!) 96.9 F (36.1 C)   Resp 18   Ht 5' 7 (1.702 m)   Wt 115 lb 6.4 oz (52.3 kg)   SpO2 99%   BMI 18.07 kg/m

## 2024-01-29 NOTE — Progress Notes (Signed)
 Radiation Oncology         [phone] ________________________________  Name: FREYJA GOVEA MRN: [id number]  Date: 01/31/2024  DOB: 1935/08/25  Re-Evaluation Note  CC: Seabron Lenis, MD  Odean Potts, MD  No diagnosis found.  Diagnosis:   Left Chest Wall Mass - Invasive and in situ lobular carcinoma, ER+ / PR+ / Her2-, Grade 2 : Diagnosed in November of 2024: s/p left breast lumpectomy and antiestrogen therapy (declined radiation therapy). Now with recurrent disease located next to the initial left chest wall mass excision site (diagnosed in August of 2025)  Narrative:  The patient returns today to discuss the role of radiation therapy in management of her recent recurrence of invasive lobular carcinoma to the left chest wall. She was initially seen here in consultation on 03/01/23 and was leaning towards to pursuing radiation therapy at that time. She however ultimately opted to forgo XRT and proceed with hormone therapy with anastrozole  alone under Dr. Odean.  Since her consultation date, she underwent re-excision to obtain clear margins at the left breast lumpectomy site on 03/04/23. Pathology showed no evidence of invasive or in situ carcinoma in the final anterior and posterior margins.   As noted above, she continued to follow with medical oncology and presented to Dr. Odean to review her survivorship care plan on 06/28/23. A breast exam was performed at that time and a 1 cm nodule was palpated in the 9 o'clock right breast, located approximately 2 cmfn. (No palpable abnormalities were appreciated in the left breast at that time).   She accordingly presented for a bilateral diagnostic mammogram and bilateral breast ultrasound on 07/07/23 which revealed: an indeterminate 2.1 cm mass/vague shadowing hypoechoic area with cysts in the 9 o'clock right breast. Imaging otherwise showed no evidence of malignancy in the left breast, or evidence of axillary lymphadenopathy bilaterally.     Biopsy of the 9 o'clock right breast on 07/19/23 thankfully showed no evidence of malignancy, and benign findings favoring fibrocystic changes including apocrine metaplasia and chronic inflammation with micro-calcs consistent with a previously ruptured cyst.   She later presented to Dr. Gara office on 11/17/23 for evaluation of a new left chest wall nodule located beneath the breast and next to the prior site of the excised chest wall lesion.   An ultrasound of the chest was accordingly obtained on 08/08 which showed a hypoechoic irregular mass in the left anterior chest, near the left lumpectomy scar, measuring 7 x 5 x 6 mm.   Further imaging was recommended and she presented for a left breast diagnostic mammogram and left breast ultrasound on 12/01/23 which showed a suspicious, 7 mm irregular mass inferior to the left breast inframammary fold localized at the 7 o'clock position, and along the medial margin of the prior lumpectomy/surgical scar. No other abnormalities were demonstrated in the left breast or axilla.   Biopsy of the 7 o'clock left breast on 12/03/23 showed grade 2 invasive lobular carcinoma measuring 7 mm in the greatest linear extent of the sample. Prognostic indicators significant for: estrogen receptor 95% positive with strong staining intensity; progesterone receptor 0% negative; Proliferation marker Ki67 at 20%; Her2 status negative; Grade 2.   She was accordingly referred back to general surgery and opted to proceed with a left breast lumpectomy without nodal biopsies on 01/04/24 under the care of Dr. Ebbie. 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 Prognostics were no repeated on surgical pathology.     Dr. Gudena has recommended that she proceed with radiation therapy to the site of recurrence  which we will discuss in detail together today. She is now willing to pursue radiation radiation therapy after opting to forgo radiation therapy to her initial left chest wall lesion.   Regarding hormonal therapy, Dr. Gudena would like to keep her on anastrozole .   Other pertinent imaging performed thus far includes a CT CAP with contrast on 01/21/24 which demonstrated: normal post-op findings s/p left breast lumpectomy along the inframammary fold area, and stable asymmetric prominence of the left axillary and subpectoral lymph nodes, all with normal cortical and hilar morphology of uncertain significance. Overall, CT findings were without evidence of overt lymphadenopathy or metastatic disease in the chest, abdomen, or pelvis.  On review of systems, the patient reports ***. She denies *** and any other symptoms.    Allergies:  has no known allergies.  Meds: Current Outpatient Medications  Medication Sig Dispense Refill   anastrozole  (ARIMIDEX ) 1 MG tablet Take 1 tablet (1 mg total) by mouth daily. 90 tablet 3   CALCIUM PO Take 1 tablet by mouth daily.     cetirizine (ZYRTEC) 10 MG tablet Take 10 mg by mouth at bedtime.     denosumab  (PROLIA ) 60 MG/ML SOSY injection Inject 60 mg into the skin every 6 (six) months.     Multiple Vitamin (MULTIVITAMIN) tablet Take 1 tablet by mouth daily.     VITAMIN D PO Take 1 capsule by mouth daily.     No current facility-administered medications for this encounter.    Physical Findings: The patient is in no acute distress. Patient is alert and oriented.  vitals were not taken for this visit.  No significant changes. Lungs are clear to auscultation bilaterally. Heart has regular rate and rhythm. No palpable cervical, supraclavicular, or axillary adenopathy. Abdomen soft, non-tender, normal bowel sounds. Right Breast: no palpable mass, nipple discharge or bleeding. Left Breast: ***  Lab Findings: Lab Results  Component Value Date   WBC 4.4 01/17/2024    HGB 12.9 01/17/2024   HCT 38.2 01/17/2024   MCV 91.2 01/17/2024   PLT 177 01/17/2024    Radiographic Findings: CT CHEST ABDOMEN PELVIS W CONTRAST Result Date: 01/24/2024 CLINICAL DATA:  Recurrent breast cancer staging * Tracking Code: BO * EXAM: CT CHEST, ABDOMEN, AND PELVIS WITH CONTRAST TECHNIQUE: Multidetector CT imaging of the chest, abdomen and pelvis was performed following the standard protocol during bolus administration of intravenous contrast. RADIATION DOSE REDUCTION: This exam was performed according to the departmental dose-optimization program which includes automated exposure control, adjustment of the mA and/or kV according to patient size and/or use of iterative reconstruction technique. CONTRAST:  80mL OMNIPAQUE IOHEXOL 300 MG/ML  SOLN COMPARISON:  CT chest, 12/30/2022 FINDINGS: CT CHEST FINDINGS Cardiovascular: Aortic atherosclerosis. Mild cardiomegaly. No pericardial effusion. Mediastinum/Nodes: Mild asymmetric prominence left axillary and subpectoral lymph nodes measuring up 2 1.4 x 0.8 cm with normal cortical and hilar morphology, similar to prior examination (series 2, image 19, 13). No overtly enlarged mediastinal, hilar, or axillary lymph nodes. Thyroid  gland, trachea, and esophagus demonstrate no significant findings. Lungs/Pleura: Lungs are clear. No pleural effusion or pneumothorax. Musculoskeletal: Postoperative findings of left lumpectomy along the inframammary fold (series 2, image 48). No acute osseous findings. CT ABDOMEN PELVIS FINDINGS Hepatobiliary: No solid liver abnormality is seen. Small low-attenuation liver lesions, unchanged compared to prior, presumed  small cysts or hemangiomata and benign. No gallstones, gallbladder wall thickening, or biliary dilatation. Pancreas: Unremarkable. No pancreatic ductal dilatation or surrounding inflammatory changes. Spleen: Normal in size without significant abnormality. Adrenals/Urinary Tract: Adrenal glands are unremarkable.  Kidneys are normal, without renal calculi, solid lesion, or hydronephrosis. Bladder is unremarkable. Stomach/Bowel: Stomach is within normal limits. Appendix appears normal. No evidence of bowel wall thickening, distention, or inflammatory changes. Vascular/Lymphatic: Aortic atherosclerosis. No enlarged abdominal or pelvic lymph nodes. Reproductive: No mass or other abnormality. Other: No abdominal wall hernia or abnormality. No ascites. Musculoskeletal: No acute osseous findings. IMPRESSION: 1. Postoperative findings of left lumpectomy along the inframammary fold. 2. Unchanged, asymmetric prominence of the left axillary and subpectoral lymph nodes with normal cortical and hilar morphology, of uncertain significance. PET-CT or tissue sampling can be considered to further assess for metastatic disease. 3. No evidence of overt lymphadenopathy or metastatic disease in the chest, abdomen, or pelvis. Aortic Atherosclerosis (ICD10-I70.0). Electronically Signed   By: Marolyn JONETTA Jaksch M.D.   On: 01/24/2024 06:29    Impression:   Left Chest Wall Mass - Invasive and in situ lobular carcinoma, ER+ / PR+ / Her2-, Grade 2 : Diagnosed in November of 2024: s/p left breast lumpectomy and antiestrogen therapy (declined radiation therapy). Now with recurrent disease located next to the initial left chest wall mass excision site (diagnosed in August of 2025)  Today, I talked to the patient and family about the findings and work-up thus far.  We discussed the natural history of *** and general treatment, highlighting the role of radiotherapy in the management.  We discussed the available radiation techniques, and focused on the details of logistics and delivery.  We reviewed the anticipated acute and late sequelae associated with radiation in this setting.  The patient was encouraged to ask questions that I answered to the best of my ability. *** A patient consent form was discussed and signed.  We retained a copy for our records.   The patient would like to proceed with radiation and will be scheduled for CT simulation.  Plan: ***  *** minutes of total time was spent for this patient encounter, including preparation, face-to-face counseling with the patient and coordination of care, physical exam, and documentation of the encounter.   -----------------------------------  Lynwood CHARM Nasuti, PhD, MD  This document serves as a record of services personally performed by Lynwood Nasuti, MD. It was created on his behalf by Dorthy Fuse, a trained medical scribe. The creation of this record is based on the scribe's personal observations and the provider's statements to them. This document has been checked and approved by the attending provider.

## 2024-01-31 ENCOUNTER — Encounter: Payer: Self-pay | Admitting: Radiation Oncology

## 2024-01-31 ENCOUNTER — Ambulatory Visit: Admitting: Radiation Oncology

## 2024-01-31 ENCOUNTER — Ambulatory Visit
Admission: RE | Admit: 2024-01-31 | Discharge: 2024-01-31 | Disposition: A | Source: Ambulatory Visit | Attending: Radiation Oncology | Admitting: Radiation Oncology

## 2024-01-31 ENCOUNTER — Ambulatory Visit
Admission: RE | Admit: 2024-01-31 | Discharge: 2024-01-31 | Disposition: A | Discharge status: Home / Self Care | Source: Ambulatory Visit | Attending: Radiation Oncology | Admitting: Radiation Oncology

## 2024-01-31 VITALS — BP 166/75 | HR 78 | Temp 96.9°F | Resp 18 | Ht 67.0 in | Wt 115.4 lb

## 2024-01-31 DIAGNOSIS — I517 Cardiomegaly: Secondary | ICD-10-CM | POA: Insufficient documentation

## 2024-01-31 DIAGNOSIS — Z170 Estrogen receptor positive status [ER+]: Secondary | ICD-10-CM

## 2024-01-31 DIAGNOSIS — K769 Liver disease, unspecified: Secondary | ICD-10-CM | POA: Insufficient documentation

## 2024-01-31 DIAGNOSIS — Z1732 Human epidermal growth factor receptor 2 negative status: Secondary | ICD-10-CM | POA: Diagnosis not present

## 2024-01-31 DIAGNOSIS — C50112 Malignant neoplasm of central portion of left female breast: Secondary | ICD-10-CM | POA: Diagnosis not present

## 2024-01-31 DIAGNOSIS — I700 Atherosclerosis of aorta: Secondary | ICD-10-CM | POA: Diagnosis not present

## 2024-01-31 DIAGNOSIS — Z79899 Other long term (current) drug therapy: Secondary | ICD-10-CM | POA: Diagnosis not present

## 2024-01-31 DIAGNOSIS — Z1721 Progesterone receptor positive status: Secondary | ICD-10-CM | POA: Insufficient documentation

## 2024-01-31 DIAGNOSIS — Z79811 Long term (current) use of aromatase inhibitors: Secondary | ICD-10-CM | POA: Diagnosis not present

## 2024-01-31 DIAGNOSIS — C50512 Malignant neoplasm of lower-outer quadrant of left female breast: Secondary | ICD-10-CM | POA: Insufficient documentation

## 2024-02-01 ENCOUNTER — Ambulatory Visit
Admission: RE | Admit: 2024-02-01 | Discharge: 2024-02-01 | Disposition: A | Discharge status: Home / Self Care | Source: Ambulatory Visit | Attending: Radiation Oncology | Admitting: Radiation Oncology

## 2024-02-01 ENCOUNTER — Encounter: Payer: Self-pay | Admitting: *Deleted

## 2024-02-01 DIAGNOSIS — C50512 Malignant neoplasm of lower-outer quadrant of left female breast: Secondary | ICD-10-CM

## 2024-02-01 DIAGNOSIS — C50112 Malignant neoplasm of central portion of left female breast: Secondary | ICD-10-CM | POA: Diagnosis not present

## 2024-02-01 DIAGNOSIS — Z510 Encounter for antineoplastic radiation therapy: Secondary | ICD-10-CM | POA: Insufficient documentation

## 2024-02-06 DIAGNOSIS — Z170 Estrogen receptor positive status [ER+]: Secondary | ICD-10-CM | POA: Diagnosis not present

## 2024-02-06 DIAGNOSIS — C50112 Malignant neoplasm of central portion of left female breast: Secondary | ICD-10-CM | POA: Diagnosis not present

## 2024-02-06 DIAGNOSIS — Z510 Encounter for antineoplastic radiation therapy: Secondary | ICD-10-CM | POA: Diagnosis not present

## 2024-02-09 ENCOUNTER — Ambulatory Visit
Admission: RE | Admit: 2024-02-09 | Discharge: 2024-02-09 | Disposition: A | Source: Ambulatory Visit | Attending: Radiation Oncology | Admitting: Radiation Oncology

## 2024-02-09 ENCOUNTER — Other Ambulatory Visit: Payer: Self-pay

## 2024-02-09 DIAGNOSIS — Z170 Estrogen receptor positive status [ER+]: Secondary | ICD-10-CM | POA: Diagnosis not present

## 2024-02-09 DIAGNOSIS — Z510 Encounter for antineoplastic radiation therapy: Secondary | ICD-10-CM | POA: Diagnosis not present

## 2024-02-09 DIAGNOSIS — C50112 Malignant neoplasm of central portion of left female breast: Secondary | ICD-10-CM | POA: Diagnosis not present

## 2024-02-09 LAB — RAD ONC ARIA SESSION SUMMARY
Course Elapsed Days: 0
Plan Fractions Treated to Date: 1
Plan Prescribed Dose Per Fraction: 2.67 Gy
Plan Total Fractions Prescribed: 15
Plan Total Prescribed Dose: 40.05 Gy
Reference Point Dosage Given to Date: 2.67 Gy
Reference Point Session Dosage Given: 2.67 Gy
Session Number: 1

## 2024-02-10 ENCOUNTER — Other Ambulatory Visit: Payer: Self-pay

## 2024-02-10 ENCOUNTER — Ambulatory Visit
Admission: RE | Admit: 2024-02-10 | Discharge: 2024-02-10 | Attending: Radiation Oncology | Admitting: Radiation Oncology

## 2024-02-10 DIAGNOSIS — Z510 Encounter for antineoplastic radiation therapy: Secondary | ICD-10-CM | POA: Diagnosis not present

## 2024-02-10 DIAGNOSIS — Z170 Estrogen receptor positive status [ER+]: Secondary | ICD-10-CM | POA: Diagnosis not present

## 2024-02-10 DIAGNOSIS — C50112 Malignant neoplasm of central portion of left female breast: Secondary | ICD-10-CM | POA: Diagnosis not present

## 2024-02-10 LAB — RAD ONC ARIA SESSION SUMMARY
Course Elapsed Days: 1
Plan Fractions Treated to Date: 2
Plan Prescribed Dose Per Fraction: 2.67 Gy
Plan Total Fractions Prescribed: 15
Plan Total Prescribed Dose: 40.05 Gy
Reference Point Dosage Given to Date: 5.34 Gy
Reference Point Session Dosage Given: 2.67 Gy
Session Number: 2

## 2024-02-11 ENCOUNTER — Other Ambulatory Visit: Payer: Self-pay

## 2024-02-11 ENCOUNTER — Ambulatory Visit
Admission: RE | Admit: 2024-02-11 | Discharge: 2024-02-11 | Disposition: A | Discharge status: Home / Self Care | Source: Ambulatory Visit | Attending: Radiation Oncology | Admitting: Radiation Oncology

## 2024-02-11 DIAGNOSIS — Z170 Estrogen receptor positive status [ER+]: Secondary | ICD-10-CM | POA: Diagnosis not present

## 2024-02-11 DIAGNOSIS — C50112 Malignant neoplasm of central portion of left female breast: Secondary | ICD-10-CM | POA: Diagnosis not present

## 2024-02-11 DIAGNOSIS — Z510 Encounter for antineoplastic radiation therapy: Secondary | ICD-10-CM | POA: Diagnosis not present

## 2024-02-11 LAB — RAD ONC ARIA SESSION SUMMARY
Course Elapsed Days: 2
Plan Fractions Treated to Date: 3
Plan Prescribed Dose Per Fraction: 2.67 Gy
Plan Total Fractions Prescribed: 15
Plan Total Prescribed Dose: 40.05 Gy
Reference Point Dosage Given to Date: 8.01 Gy
Reference Point Session Dosage Given: 2.67 Gy
Session Number: 3

## 2024-02-14 ENCOUNTER — Other Ambulatory Visit: Payer: Self-pay

## 2024-02-14 ENCOUNTER — Encounter: Payer: Self-pay | Admitting: Radiology

## 2024-02-14 ENCOUNTER — Ambulatory Visit
Admission: RE | Admit: 2024-02-14 | Discharge: 2024-02-14 | Disposition: A | Discharge status: Home / Self Care | Source: Ambulatory Visit | Attending: Radiation Oncology | Admitting: Radiation Oncology

## 2024-02-14 DIAGNOSIS — Z170 Estrogen receptor positive status [ER+]: Secondary | ICD-10-CM | POA: Diagnosis not present

## 2024-02-14 DIAGNOSIS — C50512 Malignant neoplasm of lower-outer quadrant of left female breast: Secondary | ICD-10-CM | POA: Insufficient documentation

## 2024-02-14 DIAGNOSIS — Z510 Encounter for antineoplastic radiation therapy: Secondary | ICD-10-CM | POA: Diagnosis not present

## 2024-02-14 DIAGNOSIS — C50112 Malignant neoplasm of central portion of left female breast: Secondary | ICD-10-CM | POA: Diagnosis not present

## 2024-02-14 LAB — RAD ONC ARIA SESSION SUMMARY
Course Elapsed Days: 5
Plan Fractions Treated to Date: 4
Plan Prescribed Dose Per Fraction: 2.67 Gy
Plan Total Fractions Prescribed: 15
Plan Total Prescribed Dose: 40.05 Gy
Reference Point Dosage Given to Date: 10.68 Gy
Reference Point Session Dosage Given: 2.67 Gy
Session Number: 4

## 2024-02-15 ENCOUNTER — Ambulatory Visit
Admission: RE | Admit: 2024-02-15 | Discharge: 2024-02-15 | Disposition: A | Discharge status: Home / Self Care | Source: Ambulatory Visit | Attending: Radiation Oncology | Admitting: Radiation Oncology

## 2024-02-15 ENCOUNTER — Ambulatory Visit
Admission: RE | Admit: 2024-02-15 | Discharge: 2024-02-15 | Disposition: A | Source: Ambulatory Visit | Attending: Radiation Oncology | Admitting: Radiation Oncology

## 2024-02-15 ENCOUNTER — Other Ambulatory Visit: Payer: Self-pay

## 2024-02-15 DIAGNOSIS — C50512 Malignant neoplasm of lower-outer quadrant of left female breast: Secondary | ICD-10-CM

## 2024-02-15 DIAGNOSIS — Z170 Estrogen receptor positive status [ER+]: Secondary | ICD-10-CM | POA: Diagnosis not present

## 2024-02-15 DIAGNOSIS — C50112 Malignant neoplasm of central portion of left female breast: Secondary | ICD-10-CM | POA: Diagnosis not present

## 2024-02-15 DIAGNOSIS — Z510 Encounter for antineoplastic radiation therapy: Secondary | ICD-10-CM | POA: Diagnosis not present

## 2024-02-15 LAB — RAD ONC ARIA SESSION SUMMARY
Course Elapsed Days: 6
Plan Fractions Treated to Date: 5
Plan Prescribed Dose Per Fraction: 2.67 Gy
Plan Total Fractions Prescribed: 15
Plan Total Prescribed Dose: 40.05 Gy
Reference Point Dosage Given to Date: 13.35 Gy
Reference Point Session Dosage Given: 2.67 Gy
Session Number: 5

## 2024-02-15 MED ORDER — RADIAPLEXRX EX GEL: Freq: Once | CUTANEOUS | Status: AC

## 2024-02-15 MED ORDER — ALRA NON-METALLIC DEODORANT (RAD-ONC)
1.0000 | Freq: Once | TOPICAL | Status: AC
  Administered 2024-02-15: 1 via TOPICAL

## 2024-02-16 ENCOUNTER — Ambulatory Visit
Admission: RE | Admit: 2024-02-16 | Discharge: 2024-02-16 | Disposition: A | Source: Ambulatory Visit | Attending: Radiation Oncology | Admitting: Radiation Oncology

## 2024-02-16 ENCOUNTER — Ambulatory Visit
Admission: RE | Admit: 2024-02-16 | Discharge: 2024-02-16 | Disposition: A | Discharge status: Home / Self Care | Source: Ambulatory Visit | Attending: Radiation Oncology | Admitting: Radiation Oncology

## 2024-02-16 ENCOUNTER — Other Ambulatory Visit: Payer: Self-pay

## 2024-02-16 VITALS — BP 137/71 | HR 85 | Temp 97.3°F | Resp 18

## 2024-02-16 DIAGNOSIS — Z170 Estrogen receptor positive status [ER+]: Secondary | ICD-10-CM | POA: Diagnosis not present

## 2024-02-16 DIAGNOSIS — C50112 Malignant neoplasm of central portion of left female breast: Secondary | ICD-10-CM | POA: Diagnosis not present

## 2024-02-16 DIAGNOSIS — C50512 Malignant neoplasm of lower-outer quadrant of left female breast: Secondary | ICD-10-CM

## 2024-02-16 DIAGNOSIS — Z510 Encounter for antineoplastic radiation therapy: Secondary | ICD-10-CM | POA: Diagnosis not present

## 2024-02-16 LAB — RAD ONC ARIA SESSION SUMMARY
Course Elapsed Days: 7
Plan Fractions Treated to Date: 6
Plan Prescribed Dose Per Fraction: 2.67 Gy
Plan Total Fractions Prescribed: 15
Plan Total Prescribed Dose: 40.05 Gy
Reference Point Dosage Given to Date: 16.02 Gy
Reference Point Session Dosage Given: 2.67 Gy
Session Number: 6

## 2024-02-16 NOTE — Progress Notes (Signed)
  Radiation Oncology         [phone] ________________________________  Name: Becky Ramirez MRN: [id number]  Date: 02/16/2024  DOB: February 23, 1936  Weekly Radiation Therapy Management    ICD-10-CM   1. Malignant neoplasm of lower-outer quadrant of left breast of female, estrogen receptor positive (HCC)  C50.512    Z17.0        Current Dose: 16 Gy     Planned Dose:  52 Gy  Narrative . . . . . . . . The patient presents for routine under treatment assessment.                                   She was to be seen again this week as she had noticed more erythema to her surgical scar in the inframammary fold.  She denies any chills or fever.  She denies any cough or pain along the surgical bed.  She denies any drainage from this area.                                 Set-up films were reviewed.                                 The chart was checked. Physical Findings. . .  temperature is 97.3 F (36.3 C) (abnormal). Her blood pressure is 137/71 and her pulse is 85. Her respiration is 18 and oxygen saturation is 98%. . Weight essentially stable.  More erythema noted to the surgical scar than was noted on Tuesday of this week.  Unsure if this represents infection or surgical change and radiation effect.  She is early during the course of her radiation treatment. Impression . . . . . . . The patient is tolerating radiation. Plan . . . . . . . . . . . . Continue treatment as planned.  Dr. Ebbie, her surgeon will be contacted to see if she needs to be seen in his office or possibly be placed on antibiotics.         ________________________________   Lynwood CHARM Nasuti, PhD, MD

## 2024-02-17 ENCOUNTER — Ambulatory Visit
Admission: RE | Admit: 2024-02-17 | Discharge: 2024-02-17 | Disposition: A | Discharge status: Home / Self Care | Source: Ambulatory Visit | Attending: Radiation Oncology | Admitting: Radiation Oncology

## 2024-02-17 ENCOUNTER — Other Ambulatory Visit: Payer: Self-pay

## 2024-02-17 DIAGNOSIS — Z510 Encounter for antineoplastic radiation therapy: Secondary | ICD-10-CM | POA: Diagnosis not present

## 2024-02-17 DIAGNOSIS — C50112 Malignant neoplasm of central portion of left female breast: Secondary | ICD-10-CM | POA: Diagnosis not present

## 2024-02-17 DIAGNOSIS — Z170 Estrogen receptor positive status [ER+]: Secondary | ICD-10-CM | POA: Diagnosis not present

## 2024-02-17 LAB — RAD ONC ARIA SESSION SUMMARY
Course Elapsed Days: 8
Plan Fractions Treated to Date: 7
Plan Prescribed Dose Per Fraction: 2.67 Gy
Plan Total Fractions Prescribed: 15
Plan Total Prescribed Dose: 40.05 Gy
Reference Point Dosage Given to Date: 18.69 Gy
Reference Point Session Dosage Given: 2.67 Gy
Session Number: 7

## 2024-02-18 ENCOUNTER — Other Ambulatory Visit: Payer: Self-pay

## 2024-02-18 ENCOUNTER — Ambulatory Visit
Admission: RE | Admit: 2024-02-18 | Discharge: 2024-02-18 | Disposition: A | Discharge status: Home / Self Care | Source: Ambulatory Visit | Attending: Radiation Oncology | Admitting: Radiation Oncology

## 2024-02-18 DIAGNOSIS — C50112 Malignant neoplasm of central portion of left female breast: Secondary | ICD-10-CM | POA: Diagnosis not present

## 2024-02-18 DIAGNOSIS — Z170 Estrogen receptor positive status [ER+]: Secondary | ICD-10-CM | POA: Diagnosis not present

## 2024-02-18 DIAGNOSIS — Z510 Encounter for antineoplastic radiation therapy: Secondary | ICD-10-CM | POA: Diagnosis not present

## 2024-02-18 LAB — RAD ONC ARIA SESSION SUMMARY
Course Elapsed Days: 9
Plan Fractions Treated to Date: 8
Plan Prescribed Dose Per Fraction: 2.67 Gy
Plan Total Fractions Prescribed: 15
Plan Total Prescribed Dose: 40.05 Gy
Reference Point Dosage Given to Date: 21.36 Gy
Reference Point Session Dosage Given: 2.67 Gy
Session Number: 8

## 2024-02-21 ENCOUNTER — Ambulatory Visit
Admission: RE | Admit: 2024-02-21 | Discharge: 2024-02-21 | Disposition: A | Discharge status: Home / Self Care | Source: Ambulatory Visit | Attending: Radiation Oncology | Admitting: Radiation Oncology

## 2024-02-21 ENCOUNTER — Other Ambulatory Visit: Payer: Self-pay

## 2024-02-21 DIAGNOSIS — Z170 Estrogen receptor positive status [ER+]: Secondary | ICD-10-CM | POA: Diagnosis not present

## 2024-02-21 DIAGNOSIS — Z510 Encounter for antineoplastic radiation therapy: Secondary | ICD-10-CM | POA: Diagnosis not present

## 2024-02-21 DIAGNOSIS — C50112 Malignant neoplasm of central portion of left female breast: Secondary | ICD-10-CM | POA: Diagnosis not present

## 2024-02-21 LAB — RAD ONC ARIA SESSION SUMMARY
Course Elapsed Days: 12
Plan Fractions Treated to Date: 9
Plan Prescribed Dose Per Fraction: 2.67 Gy
Plan Total Fractions Prescribed: 15
Plan Total Prescribed Dose: 40.05 Gy
Reference Point Dosage Given to Date: 24.03 Gy
Reference Point Session Dosage Given: 2.67 Gy
Session Number: 9

## 2024-02-22 ENCOUNTER — Ambulatory Visit
Admission: RE | Admit: 2024-02-22 | Discharge: 2024-02-22 | Disposition: A | Discharge status: Home / Self Care | Source: Ambulatory Visit | Attending: Radiation Oncology | Admitting: Radiation Oncology

## 2024-02-22 ENCOUNTER — Other Ambulatory Visit: Payer: Self-pay

## 2024-02-22 DIAGNOSIS — C50112 Malignant neoplasm of central portion of left female breast: Secondary | ICD-10-CM | POA: Diagnosis not present

## 2024-02-22 DIAGNOSIS — Z510 Encounter for antineoplastic radiation therapy: Secondary | ICD-10-CM | POA: Diagnosis not present

## 2024-02-22 DIAGNOSIS — Z170 Estrogen receptor positive status [ER+]: Secondary | ICD-10-CM | POA: Diagnosis not present

## 2024-02-22 LAB — RAD ONC ARIA SESSION SUMMARY
Course Elapsed Days: 13
Plan Fractions Treated to Date: 10
Plan Prescribed Dose Per Fraction: 2.67 Gy
Plan Total Fractions Prescribed: 15
Plan Total Prescribed Dose: 40.05 Gy
Reference Point Dosage Given to Date: 26.7 Gy
Reference Point Session Dosage Given: 2.67 Gy
Session Number: 10

## 2024-02-23 ENCOUNTER — Other Ambulatory Visit: Payer: Self-pay

## 2024-02-23 ENCOUNTER — Ambulatory Visit
Admission: RE | Admit: 2024-02-23 | Discharge: 2024-02-23 | Disposition: A | Discharge status: Home / Self Care | Source: Ambulatory Visit | Attending: Radiation Oncology | Admitting: Radiation Oncology

## 2024-02-23 DIAGNOSIS — Z510 Encounter for antineoplastic radiation therapy: Secondary | ICD-10-CM | POA: Diagnosis not present

## 2024-02-23 DIAGNOSIS — Z170 Estrogen receptor positive status [ER+]: Secondary | ICD-10-CM | POA: Diagnosis not present

## 2024-02-23 DIAGNOSIS — C50112 Malignant neoplasm of central portion of left female breast: Secondary | ICD-10-CM | POA: Diagnosis not present

## 2024-02-23 LAB — RAD ONC ARIA SESSION SUMMARY
Course Elapsed Days: 14
Plan Fractions Treated to Date: 11
Plan Prescribed Dose Per Fraction: 2.67 Gy
Plan Total Fractions Prescribed: 15
Plan Total Prescribed Dose: 40.05 Gy
Reference Point Dosage Given to Date: 29.37 Gy
Reference Point Session Dosage Given: 2.67 Gy
Session Number: 11

## 2024-02-24 ENCOUNTER — Ambulatory Visit
Admission: RE | Admit: 2024-02-24 | Discharge: 2024-02-24 | Disposition: A | Discharge status: Home / Self Care | Source: Ambulatory Visit | Attending: Radiation Oncology | Admitting: Radiation Oncology

## 2024-02-24 ENCOUNTER — Other Ambulatory Visit: Payer: Self-pay

## 2024-02-24 DIAGNOSIS — Z170 Estrogen receptor positive status [ER+]: Secondary | ICD-10-CM | POA: Diagnosis not present

## 2024-02-24 DIAGNOSIS — Z510 Encounter for antineoplastic radiation therapy: Secondary | ICD-10-CM | POA: Diagnosis not present

## 2024-02-24 DIAGNOSIS — C50112 Malignant neoplasm of central portion of left female breast: Secondary | ICD-10-CM | POA: Diagnosis not present

## 2024-02-24 LAB — RAD ONC ARIA SESSION SUMMARY
Course Elapsed Days: 15
Plan Fractions Treated to Date: 12
Plan Prescribed Dose Per Fraction: 2.67 Gy
Plan Total Fractions Prescribed: 15
Plan Total Prescribed Dose: 40.05 Gy
Reference Point Dosage Given to Date: 32.04 Gy
Reference Point Session Dosage Given: 2.67 Gy
Session Number: 12

## 2024-02-25 ENCOUNTER — Other Ambulatory Visit: Payer: Self-pay

## 2024-02-25 ENCOUNTER — Ambulatory Visit
Admission: RE | Admit: 2024-02-25 | Discharge: 2024-02-25 | Disposition: A | Discharge status: Home / Self Care | Source: Ambulatory Visit | Attending: Radiation Oncology | Admitting: Radiation Oncology

## 2024-02-25 DIAGNOSIS — Z170 Estrogen receptor positive status [ER+]: Secondary | ICD-10-CM | POA: Diagnosis not present

## 2024-02-25 DIAGNOSIS — C50112 Malignant neoplasm of central portion of left female breast: Secondary | ICD-10-CM | POA: Diagnosis not present

## 2024-02-25 DIAGNOSIS — Z510 Encounter for antineoplastic radiation therapy: Secondary | ICD-10-CM | POA: Diagnosis not present

## 2024-02-25 LAB — RAD ONC ARIA SESSION SUMMARY
Course Elapsed Days: 16
Plan Fractions Treated to Date: 13
Plan Prescribed Dose Per Fraction: 2.67 Gy
Plan Total Fractions Prescribed: 15
Plan Total Prescribed Dose: 40.05 Gy
Reference Point Dosage Given to Date: 34.71 Gy
Reference Point Session Dosage Given: 2.67 Gy
Session Number: 13

## 2024-02-28 ENCOUNTER — Other Ambulatory Visit: Payer: Self-pay

## 2024-02-28 ENCOUNTER — Ambulatory Visit
Admission: RE | Admit: 2024-02-28 | Discharge: 2024-02-28 | Disposition: A | Discharge status: Home / Self Care | Source: Ambulatory Visit | Attending: Radiation Oncology | Admitting: Radiation Oncology

## 2024-02-28 DIAGNOSIS — Z510 Encounter for antineoplastic radiation therapy: Secondary | ICD-10-CM | POA: Diagnosis not present

## 2024-02-28 DIAGNOSIS — Z170 Estrogen receptor positive status [ER+]: Secondary | ICD-10-CM | POA: Diagnosis not present

## 2024-02-28 DIAGNOSIS — C50112 Malignant neoplasm of central portion of left female breast: Secondary | ICD-10-CM | POA: Diagnosis not present

## 2024-02-28 LAB — RAD ONC ARIA SESSION SUMMARY
Course Elapsed Days: 19
Plan Fractions Treated to Date: 14
Plan Prescribed Dose Per Fraction: 2.67 Gy
Plan Total Fractions Prescribed: 15
Plan Total Prescribed Dose: 40.05 Gy
Reference Point Dosage Given to Date: 37.38 Gy
Reference Point Session Dosage Given: 2.67 Gy
Session Number: 14

## 2024-02-29 ENCOUNTER — Other Ambulatory Visit: Payer: Self-pay

## 2024-02-29 ENCOUNTER — Ambulatory Visit
Admission: RE | Admit: 2024-02-29 | Discharge: 2024-02-29 | Disposition: A | Discharge status: Home / Self Care | Source: Ambulatory Visit | Attending: Radiation Oncology | Admitting: Radiation Oncology

## 2024-02-29 ENCOUNTER — Ambulatory Visit: Admission: RE | Admit: 2024-02-29 | Source: Ambulatory Visit | Admitting: Radiation Oncology

## 2024-02-29 ENCOUNTER — Ambulatory Visit
Admission: RE | Admit: 2024-02-29 | Discharge: 2024-02-29 | Disposition: A | Discharge status: Home / Self Care | Source: Ambulatory Visit | Admitting: Radiation Oncology

## 2024-02-29 DIAGNOSIS — Z510 Encounter for antineoplastic radiation therapy: Secondary | ICD-10-CM | POA: Diagnosis not present

## 2024-02-29 DIAGNOSIS — C50512 Malignant neoplasm of lower-outer quadrant of left female breast: Secondary | ICD-10-CM

## 2024-02-29 DIAGNOSIS — C50112 Malignant neoplasm of central portion of left female breast: Secondary | ICD-10-CM | POA: Diagnosis not present

## 2024-02-29 DIAGNOSIS — Z170 Estrogen receptor positive status [ER+]: Secondary | ICD-10-CM | POA: Diagnosis not present

## 2024-02-29 LAB — RAD ONC ARIA SESSION SUMMARY
Course Elapsed Days: 20
Plan Fractions Treated to Date: 15
Plan Prescribed Dose Per Fraction: 2.67 Gy
Plan Total Fractions Prescribed: 15
Plan Total Prescribed Dose: 40.05 Gy
Reference Point Dosage Given to Date: 40.05 Gy
Reference Point Session Dosage Given: 2.67 Gy
Session Number: 15

## 2024-03-01 ENCOUNTER — Other Ambulatory Visit: Payer: Self-pay

## 2024-03-01 ENCOUNTER — Ambulatory Visit
Admission: RE | Admit: 2024-03-01 | Discharge: 2024-03-01 | Disposition: A | Discharge status: Home / Self Care | Source: Ambulatory Visit | Attending: Radiation Oncology | Admitting: Radiation Oncology

## 2024-03-01 DIAGNOSIS — Z510 Encounter for antineoplastic radiation therapy: Secondary | ICD-10-CM | POA: Diagnosis not present

## 2024-03-01 LAB — RAD ONC ARIA SESSION SUMMARY
Course Elapsed Days: 21
Plan Fractions Treated to Date: 1
Plan Prescribed Dose Per Fraction: 2 Gy
Plan Total Fractions Prescribed: 6
Plan Total Prescribed Dose: 12 Gy
Reference Point Dosage Given to Date: 2 Gy
Reference Point Session Dosage Given: 2 Gy
Session Number: 16

## 2024-03-02 ENCOUNTER — Other Ambulatory Visit: Payer: Self-pay

## 2024-03-02 ENCOUNTER — Ambulatory Visit
Admission: RE | Admit: 2024-03-02 | Discharge: 2024-03-02 | Disposition: A | Discharge status: Home / Self Care | Source: Ambulatory Visit | Attending: Radiation Oncology | Admitting: Radiation Oncology

## 2024-03-02 DIAGNOSIS — Z510 Encounter for antineoplastic radiation therapy: Secondary | ICD-10-CM | POA: Diagnosis not present

## 2024-03-02 LAB — RAD ONC ARIA SESSION SUMMARY
Course Elapsed Days: 22
Plan Fractions Treated to Date: 2
Plan Prescribed Dose Per Fraction: 2 Gy
Plan Total Fractions Prescribed: 6
Plan Total Prescribed Dose: 12 Gy
Reference Point Dosage Given to Date: 4 Gy
Reference Point Session Dosage Given: 2 Gy
Session Number: 17

## 2024-03-03 ENCOUNTER — Ambulatory Visit
Admission: RE | Admit: 2024-03-03 | Discharge: 2024-03-03 | Disposition: A | Discharge status: Home / Self Care | Source: Ambulatory Visit | Attending: Radiation Oncology | Admitting: Radiation Oncology

## 2024-03-03 ENCOUNTER — Other Ambulatory Visit: Payer: Self-pay

## 2024-03-03 DIAGNOSIS — Z510 Encounter for antineoplastic radiation therapy: Secondary | ICD-10-CM | POA: Diagnosis not present

## 2024-03-03 LAB — RAD ONC ARIA SESSION SUMMARY
Course Elapsed Days: 23
Plan Fractions Treated to Date: 3
Plan Prescribed Dose Per Fraction: 2 Gy
Plan Total Fractions Prescribed: 6
Plan Total Prescribed Dose: 12 Gy
Reference Point Dosage Given to Date: 6 Gy
Reference Point Session Dosage Given: 2 Gy
Session Number: 18

## 2024-03-06 ENCOUNTER — Other Ambulatory Visit: Payer: Self-pay

## 2024-03-06 ENCOUNTER — Ambulatory Visit
Admission: RE | Admit: 2024-03-06 | Discharge: 2024-03-06 | Disposition: A | Source: Ambulatory Visit | Attending: Radiation Oncology | Admitting: Radiation Oncology

## 2024-03-06 DIAGNOSIS — Z510 Encounter for antineoplastic radiation therapy: Secondary | ICD-10-CM | POA: Diagnosis not present

## 2024-03-06 LAB — RAD ONC ARIA SESSION SUMMARY
Course Elapsed Days: 26
Plan Fractions Treated to Date: 4
Plan Prescribed Dose Per Fraction: 2 Gy
Plan Total Fractions Prescribed: 6
Plan Total Prescribed Dose: 12 Gy
Reference Point Dosage Given to Date: 8 Gy
Reference Point Session Dosage Given: 2 Gy
Session Number: 19

## 2024-03-07 ENCOUNTER — Ambulatory Visit
Admission: RE | Admit: 2024-03-07 | Discharge: 2024-03-07 | Disposition: A | Source: Ambulatory Visit | Attending: Radiation Oncology | Admitting: Radiation Oncology

## 2024-03-07 ENCOUNTER — Ambulatory Visit
Admission: RE | Admit: 2024-03-07 | Discharge: 2024-03-07 | Disposition: A | Discharge status: Home / Self Care | Source: Ambulatory Visit | Attending: Radiation Oncology | Admitting: Radiation Oncology

## 2024-03-07 ENCOUNTER — Other Ambulatory Visit: Payer: Self-pay

## 2024-03-07 DIAGNOSIS — Z170 Estrogen receptor positive status [ER+]: Secondary | ICD-10-CM | POA: Diagnosis not present

## 2024-03-07 DIAGNOSIS — C50112 Malignant neoplasm of central portion of left female breast: Secondary | ICD-10-CM | POA: Diagnosis not present

## 2024-03-07 DIAGNOSIS — Z510 Encounter for antineoplastic radiation therapy: Secondary | ICD-10-CM | POA: Diagnosis not present

## 2024-03-07 LAB — RAD ONC ARIA SESSION SUMMARY
Course Elapsed Days: 27
Plan Fractions Treated to Date: 5
Plan Prescribed Dose Per Fraction: 2 Gy
Plan Total Fractions Prescribed: 6
Plan Total Prescribed Dose: 12 Gy
Reference Point Dosage Given to Date: 10 Gy
Reference Point Session Dosage Given: 2 Gy
Session Number: 20

## 2024-03-08 ENCOUNTER — Ambulatory Visit
Admission: RE | Admit: 2024-03-08 | Discharge: 2024-03-08 | Disposition: A | Source: Ambulatory Visit | Attending: Radiation Oncology | Admitting: Radiation Oncology

## 2024-03-08 ENCOUNTER — Other Ambulatory Visit: Payer: Self-pay

## 2024-03-08 DIAGNOSIS — Z510 Encounter for antineoplastic radiation therapy: Secondary | ICD-10-CM | POA: Diagnosis not present

## 2024-03-08 LAB — RAD ONC ARIA SESSION SUMMARY
Course Elapsed Days: 28
Plan Fractions Treated to Date: 6
Plan Prescribed Dose Per Fraction: 2 Gy
Plan Total Fractions Prescribed: 6
Plan Total Prescribed Dose: 12 Gy
Reference Point Dosage Given to Date: 12 Gy
Reference Point Session Dosage Given: 2 Gy
Session Number: 21

## 2024-03-13 NOTE — Radiation Completion Notes (Addendum)
" °  Radiation Oncology         [phone] ________________________________  Name: Becky Ramirez MRN: [id number]  Date of Service: 03/08/2024  DOB: [birth year]  End of Treatment Note  Diagnosis: C50.112 Malignant neoplasm of central portion of left female breast Staging on 2023-03-03: Malignant neoplasm of lower-outer quadrant of left breast of female, estrogen receptor positive (HCC) T=cT2, N=cN0, M=cM0 Staging on 2023-02-15: Malignant neoplasm of lower-outer quadrant of left breast of female, estrogen receptor positive (HCC) T=pT2, N=pN0, M=cM0 Intent: Curative     ==========DELIVERED PLANS==========  First Treatment Date: 2024-02-09 Last Treatment Date: 2024-03-08   Plan Name: Breast_L_BH Site: Breast, Left Technique: 3D Mode: Photon Dose Per Fraction: 2.67 Gy Prescribed Dose (Delivered / Prescribed): 40.05 Gy / 40.05 Gy Prescribed Fxs (Delivered / Prescribed): 15 / 15   Plan Name: Breast_L_Bst Site: Breast, Left Technique: Electron Mode: Electron Dose Per Fraction: 2 Gy Prescribed Dose (Delivered / Prescribed): 12 Gy / 12 Gy Prescribed Fxs (Delivered / Prescribed): 6 / 6     ====================================   The patient tolerated radiation. She developed anticipated skin changes in the treatment field.   The patient will return in one month and will continue follow up with Dr. Gudena as well.      Ronita Due, PA-C "

## 2024-04-04 ENCOUNTER — Encounter: Payer: Self-pay | Admitting: Radiation Oncology

## 2024-04-08 ENCOUNTER — Other Ambulatory Visit: Payer: Self-pay | Admitting: Hematology and Oncology

## 2024-04-10 ENCOUNTER — Ambulatory Visit
Admission: RE | Admit: 2024-04-10 | Discharge: 2024-04-10 | Disposition: A | Discharge status: Home / Self Care | Source: Ambulatory Visit | Attending: Radiation Oncology | Admitting: Radiation Oncology

## 2024-04-10 ENCOUNTER — Encounter: Payer: Self-pay | Admitting: Radiation Oncology

## 2024-04-10 VITALS — BP 143/76 | HR 76 | Temp 97.1°F | Resp 18 | Ht 67.0 in | Wt 116.1 lb

## 2024-04-10 DIAGNOSIS — Z923 Personal history of irradiation: Secondary | ICD-10-CM | POA: Insufficient documentation

## 2024-04-10 DIAGNOSIS — Z170 Estrogen receptor positive status [ER+]: Secondary | ICD-10-CM | POA: Diagnosis not present

## 2024-04-10 DIAGNOSIS — Z1732 Human epidermal growth factor receptor 2 negative status: Secondary | ICD-10-CM | POA: Diagnosis not present

## 2024-04-10 DIAGNOSIS — C50512 Malignant neoplasm of lower-outer quadrant of left female breast: Secondary | ICD-10-CM | POA: Diagnosis present

## 2024-04-10 DIAGNOSIS — Z79811 Long term (current) use of aromatase inhibitors: Secondary | ICD-10-CM | POA: Insufficient documentation

## 2024-04-10 DIAGNOSIS — Z1721 Progesterone receptor positive status: Secondary | ICD-10-CM | POA: Insufficient documentation

## 2024-04-10 NOTE — Progress Notes (Signed)
 Becky Ramirez is here today for follow up post radiation to the breast.   Breast Side:left   They completed their radiation on: 03/08/24   Does the patient complain of any of the following: Post radiation skin issues:  Mild redness.  Breast Tenderness: No Breast Swelling: No Lymphadema: No Range of Motion limitations: No Fatigue post radiation: No Appetite good/fair/poor: good  Additional comments if applicable:  BP (!) 143/76 (BP Location: Left Arm, Patient Position: Sitting)   Pulse 76   Temp (!) 97.1 F (36.2 C) (Temporal)   Resp 18   Ht 5' 7 (1.702 m)   Wt 116 lb 2 oz (52.7 kg)   SpO2 99%   BMI 18.19 kg/m

## 2024-04-10 NOTE — Progress Notes (Signed)
 "  Radiation Oncology         [phone] ________________________________  Name: Becky Ramirez MRN: [id number]  Date: 04/10/2024  DOB: [birth year]  Follow-Up Visit Note  CC: Seabron Lenis, MD  Seabron Lenis, MD    ICD-10-CM   1. Malignant neoplasm of lower-outer quadrant of left breast of female, estrogen receptor positive [facility])  C50.512    Z17.0       Diagnosis:  Left Chest Wall Mass - Invasive and in situ lobular carcinoma, ER+ / PR+ / Her2-, Grade 2 : Diagnosed in November of 2024: s/p left breast lumpectomy and antiestrogen therapy (declined radiation therapy). Developed recurrent disease located next to the initial left chest wall mass excision site in August of 2025   Interval Since Last Radiation: 1 month and 3 days   Intent: Curative  Radiation Treatment Dates: First Treatment Date: 2024-02-09 -- Last Treatment Date: 2024-03-08 Site/Dose/Technique/Mode:  Plan Name: Breast_L_BH Site: Breast, Left Technique: 3D Mode: Photon Dose Per Fraction: 2.67 Gy Prescribed Dose (Delivered / Prescribed): 40.05 Gy / 40.05 Gy Prescribed Fxs (Delivered / Prescribed): 15 / 15   Plan Name: Breast_L_Bst Site: Breast, Left Technique: Electron Mode: Electron Dose Per Fraction: 2 Gy Prescribed Dose (Delivered / Prescribed): 12 Gy / 12 Gy Prescribed Fxs (Delivered / Prescribed): 6 / 6  Narrative:  The patient returns today for a routine follow-up visit 1 month after completing radiation therapy. She developed anticipated skin changes in the treatment field but otherwise tolerated radiation therapy quite well overall.    Of note: She did present to us  for a skin check on 02/16/24 due an increase in erythema at the surgical site/scar. The area of diffuse erythema remained relatively stable throughout the remainder of treatment course.       No other significant interval history since the patient completed radiation therapy.   On evaluation today the patient reports tolerating her radiation  therapy extremely well.  She continues to exercise on a regular basis.  She denies any changes in her breathing or significant cough.  She denies any itching along the inframammary fold or breast area.                    Allergies:  has no known allergies.  Meds: Current Outpatient Medications  Medication Sig Dispense Refill   anastrozole  (ARIMIDEX ) 1 MG tablet TAKE 1 TABLET(1 MG) BY MOUTH DAILY 90 tablet 3   CALCIUM PO Take 1 tablet by mouth daily.     cetirizine (ZYRTEC) 10 MG tablet Take 10 mg by mouth at bedtime.     denosumab  (PROLIA ) 60 MG/ML SOSY injection Inject 60 mg into the skin every 6 (six) months.     Multiple Vitamin (MULTIVITAMIN) tablet Take 1 tablet by mouth daily.     VITAMIN D PO Take 1 capsule by mouth daily.     No current facility-administered medications for this encounter.    Physical Findings: The patient is in no acute distress. Patient is alert and oriented.  height is 5' 7 (1.702 m) and weight is 116 lb 2 oz (52.7 kg). Her temporal temperature is 97.1 F (36.2 C) (abnormal). Her blood pressure is 143/76 (abnormal) and her pulse is 76. Her respiration is 18 and oxygen saturation is 99%. .  No significant changes. Lungs are clear to auscultation bilaterally. Heart has regular rate and rhythm. No palpable cervical, supraclavicular, or axillary adenopathy. Abdomen soft, non-tender, normal bowel sounds.  Left Breast/Chest: Skin is healed well.  Hyperpigmentation changes noticed particularly along the surgical scar in the inframammary fold.  No skin breakdown.  No palpable or visible signs of recurrence.  No palpable recurrence within the left breast area.   Lab Findings: Lab Results  Component Value Date   WBC 4.4 01/17/2024   HGB 12.9 01/17/2024   HCT 38.2 01/17/2024   MCV 91.2 01/17/2024   PLT 177 01/17/2024    Radiographic Findings: No results found.  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 Developed recurrent disease located next to the initial left chest wall mass excision site in August of 2025   She has recovered extremely well from her radiation therapy.  No evidence of recurrence on clinical exam today.  Plan: As needed follow-up in radiation oncology.  She will continue to follow-up with Dr. Ebbie and Dr. Odean.  She will continue on Arimidex .    ____________________________________  Lynwood CHARM Nasuti, PhD, MD  This document serves as a record of services personally performed by Lynwood Nasuti, MD. It was created on his behalf by Dorthy Fuse, a trained medical scribe. The creation of this record is based on the scribe's personal observations and the provider's statements to them. This document has been checked and approved by the attending provider.   "

## 2024-05-25 ENCOUNTER — Ambulatory Visit (INDEPENDENT_AMBULATORY_CARE_PROVIDER_SITE_OTHER): Admitting: Otolaryngology

## 2024-07-27 ENCOUNTER — Inpatient Hospital Stay: Admitting: Hematology and Oncology

## 2024-10-20 ENCOUNTER — Inpatient Hospital Stay: Admitting: Adult Health
